# Patient Record
Sex: Female | Born: 1980
Health system: Southern US, Community
[De-identification: ages and names within clinical notes are randomized; demographics above are authoritative.]

## PROBLEM LIST (undated history)

## (undated) DIAGNOSIS — D649 Anemia, unspecified: Secondary | ICD-10-CM

## (undated) DIAGNOSIS — F419 Anxiety disorder, unspecified: Secondary | ICD-10-CM

## (undated) DIAGNOSIS — B999 Unspecified infectious disease: Secondary | ICD-10-CM

## (undated) HISTORY — DX: Anxiety disorder, unspecified: F41.9

## (undated) HISTORY — PX: WISDOM TOOTH EXTRACTION: SHX21

## (undated) HISTORY — DX: Anemia, unspecified: D64.9

## (undated) HISTORY — DX: Unspecified infectious disease: B99.9

---

## 2003-03-28 ENCOUNTER — Other Ambulatory Visit: Admission: RE | Admit: 2003-03-28 | Discharge: 2003-03-28 | Payer: Self-pay | Admitting: Obstetrics and Gynecology

## 2005-02-13 ENCOUNTER — Other Ambulatory Visit: Admission: RE | Admit: 2005-02-13 | Discharge: 2005-02-13 | Payer: Self-pay | Admitting: Obstetrics and Gynecology

## 2009-09-30 ENCOUNTER — Inpatient Hospital Stay (HOSPITAL_COMMUNITY): Admission: AD | Admit: 2009-09-30 | Discharge: 2009-10-03 | Payer: Self-pay | Admitting: Obstetrics and Gynecology

## 2009-10-01 ENCOUNTER — Encounter (INDEPENDENT_AMBULATORY_CARE_PROVIDER_SITE_OTHER): Payer: Self-pay | Admitting: Obstetrics and Gynecology

## 2009-10-14 ENCOUNTER — Inpatient Hospital Stay (HOSPITAL_COMMUNITY): Admission: AD | Admit: 2009-10-14 | Discharge: 2009-10-14 | Payer: Self-pay | Admitting: Obstetrics & Gynecology

## 2010-02-20 ENCOUNTER — Inpatient Hospital Stay (HOSPITAL_COMMUNITY): Admission: AD | Admit: 2010-02-20 | Payer: Self-pay | Admitting: Obstetrics and Gynecology

## 2010-03-21 LAB — RPR: RPR Ser Ql: NONREACTIVE

## 2010-03-21 LAB — CBC
HCT: 35.7 % — ABNORMAL LOW (ref 36.0–46.0)
HCT: 33.4 % — ABNORMAL LOW (ref 36.0–46.0)
Hemoglobin: 12.1 g/dL (ref 12.0–15.0)
MCHC: 34 g/dL (ref 30.0–36.0)
MCHC: 34.6 g/dL (ref 30.0–36.0)
MCV: 95.5 fL (ref 78.0–100.0)
Platelets: 126 10*3/uL — ABNORMAL LOW (ref 150–400)
RBC: 3.73 MIL/uL — ABNORMAL LOW (ref 3.87–5.11)
RDW: 13 % (ref 11.5–15.5)
RDW: 13.5 % (ref 11.5–15.5)
RDW: 13.6 % (ref 11.5–15.5)
WBC: 9.9 10*3/uL (ref 4.0–10.5)
WBC: 10.1 10*3/uL (ref 4.0–10.5)
WBC: 13.9 10*3/uL — ABNORMAL HIGH (ref 4.0–10.5)

## 2010-03-21 LAB — URINALYSIS, ROUTINE W REFLEX MICROSCOPIC
Bilirubin Urine: NEGATIVE
Glucose, UA: NEGATIVE mg/dL
Ketones, ur: NEGATIVE mg/dL
Nitrite: NEGATIVE
Specific Gravity, Urine: <1.005 — ABNORMAL LOW (ref 1.005–1.030)
pH: 5 (ref 5.0–8.0)

## 2010-03-21 LAB — DIFFERENTIAL
Basophils Absolute: 0 10*3/uL (ref 0.0–0.1)
Eosinophils Relative: 0 % (ref 0–5)
Lymphocytes Relative: 7 % — ABNORMAL LOW (ref 12–46)
Lymphs Abs: 0.7 10*3/uL (ref 0.7–4.0)
Monocytes Absolute: 0.3 10*3/uL (ref 0.1–1.0)
Neutro Abs: 8.9 10*3/uL — ABNORMAL HIGH (ref 1.7–7.7)

## 2010-03-21 LAB — URINE MICROSCOPIC-ADD ON

## 2010-03-21 LAB — GLUCOSE, CAPILLARY: Glucose-Capillary: 105 mg/dL — ABNORMAL HIGH (ref 70–99)

## 2012-01-06 ENCOUNTER — Ambulatory Visit (INDEPENDENT_AMBULATORY_CARE_PROVIDER_SITE_OTHER): Payer: BC Managed Care – PPO | Admitting: Obstetrics and Gynecology

## 2012-01-06 DIAGNOSIS — Z331 Pregnant state, incidental: Secondary | ICD-10-CM

## 2012-01-06 LAB — POCT URINALYSIS DIPSTICK
Bilirubin, UA: NEGATIVE
Glucose, UA: NEGATIVE
Spec Grav, UA: 1.02
Urobilinogen, UA: NEGATIVE

## 2012-01-06 NOTE — Progress Notes (Signed)
NOB interview completed.  PNV samples given.  Pt doing well. 

## 2012-01-07 ENCOUNTER — Encounter: Payer: Self-pay | Admitting: Obstetrics and Gynecology

## 2012-01-07 DIAGNOSIS — Q249 Congenital malformation of heart, unspecified: Secondary | ICD-10-CM | POA: Insufficient documentation

## 2012-01-07 DIAGNOSIS — F419 Anxiety disorder, unspecified: Secondary | ICD-10-CM | POA: Insufficient documentation

## 2012-01-07 NOTE — L&D Delivery Note (Signed)
Delivery Note At 7:00 PM a viable female was delivered via Vaginal, Spontaneous Delivery (Presentation: Left Occiput Anterior).  APGAR: 8, 9; weight .   Placenta status: Intact, Spontaneous.  Cord: 3 vessels with the following complications: .  Cord pH: not sent  Anesthesia: Epidural  Episiotomy: None Lacerations: 2nd degree Suture Repair: 2.0 chromic Est. Blood Loss (mL): 350  Mom to postpartum.  Baby to nursery-stable.  Aquiles Ruffini A 08/25/2012, 7:42 PM

## 2012-01-08 LAB — PRENATAL PANEL VII
Eosinophils Absolute: 0.1 10*3/uL (ref 0.0–0.7)
HIV: NONREACTIVE
Hemoglobin: 11.5 g/dL — ABNORMAL LOW (ref 12.0–15.0)
Hepatitis B Surface Ag: NEGATIVE
Lymphocytes Relative: 27 % (ref 12–46)
Lymphs Abs: 2 10*3/uL (ref 0.7–4.0)
Monocytes Relative: 7 % (ref 3–12)
Neutro Abs: 4.8 10*3/uL (ref 1.7–7.7)
Neutrophils Relative %: 65 % (ref 43–77)
Platelets: 247 10*3/uL (ref 150–400)
RBC: 3.78 MIL/uL — ABNORMAL LOW (ref 3.87–5.11)
Rubella: 1.33 Index — ABNORMAL HIGH (ref <0.90)
WBC: 7.4 10*3/uL (ref 4.0–10.5)

## 2012-01-08 LAB — CULTURE, OB URINE: Colony Count: NO GROWTH

## 2012-01-12 ENCOUNTER — Telehealth: Payer: Self-pay | Admitting: Obstetrics and Gynecology

## 2012-01-13 NOTE — Telephone Encounter (Signed)
Pt advised  Vonda Harth, CMA  

## 2012-01-13 NOTE — Telephone Encounter (Signed)
Nothing was significantly abnormal with her NOB labs..don't know what the phone call was about, unless it was to inform her of next visit. VL

## 2012-01-13 NOTE — Telephone Encounter (Signed)
Spoke with pt to advise that results would be discussed at f/u apt 01/28, would send to VL to have her advise if anything needs to be discussed before apt.   Dental note faxed to dentist office [phone]  Darien Ramus, CMA

## 2012-02-03 ENCOUNTER — Ambulatory Visit: Payer: BC Managed Care – PPO | Admitting: Obstetrics and Gynecology

## 2012-02-03 VITALS — BP 100/68 | Ht 63.0 in | Wt 120.0 lb

## 2012-02-03 DIAGNOSIS — Z331 Pregnant state, incidental: Secondary | ICD-10-CM

## 2012-02-03 NOTE — Progress Notes (Signed)
   Kylie Phillips is being seen today for her first obstetrical visit at [redacted]w[redacted]d gestation by LMP.  She reports sore throat and congestion for the last 2 weeks.  In the last week it has been just congestion and feels like it has moved down to her chest.  Cough is wet, but is not coughing up anything.  She has tried sudafed and nasal saline with little relief.  Went to see her PCP who rx'd Amoxicillin to take if sx get worse.  Pt requested flu shot today -- will wait for her URI to resolve  Her obstetrical history is significant for: Patient Active Problem List  Diagnosis  . Prolonged second stage of labor  . Anxiety  . Congenital heart disease    Relationship with FOB:  Married; Kylie Phillips joined her today at her visit. She is employed  Feeding plan:   Breastfeeding  Pregnancy history fully reviewed.  The following portions of the patient's history were reviewed and updated as appropriate: allergies, current medications, past family history, past medical history, past social history, past surgical history and problem list.  Review of Systems Pertinent ROS is described in HPI   Objective:   BP 100/68  Ht 5\' 3"  (1.6 m)  Wt 120 lb (54.432 kg)  BMI 21.26 kg/m2  LMP 11/20/2011 Wt Readings from Last 1 Encounters:  02/03/12 120 lb (54.432 kg)   BMI: Body mass index is 21.26 kg/(m^2).  General: alert, cooperative and no distress HEENT: grossly normal  Ears: Right some fluid seen behind TM, no erythema.  Left fluid and erythema noted. Thyroid: normal  Respiratory: clear to auscultation bilaterally Cardiovascular: regular rate and rhythm,  Breasts:  No dominant masses, nipples erect Gastrointestinal: soft, non-tender; no masses,  no organomegaly Extremities: extremities normal, no pain or edema Vaginal Bleeding: None  EXTERNAL GENITALIA: normal appearing vulva with no masses, tenderness or lesions VAGINA: no abnormal discharge or lesions CERVIX: no lesions or cervical motion  tenderness; cervix closed, long, firm UTERUS: gravid and consistent with 10 weeks ADNEXA: no masses palpable and nontender OB EXAM PELVIMETRY: appears adequate  FHR:  150  bpm  Assessment:    Pregnancy at  10w 5d URI -- will start Amoxicillin rx'd by PCP FOB-siblings had a hole in the heart (one died @ birth other is living) - Fetal echo at 20 weeks Needs flu shot -- at [facility] if URI resolved Considering waterbirth  Plan:     Prenatal panel reviewed and discussed with the patient:  yes  Pap smear collected:  Done in 2012 GC/Chlamydia collected:  yes Wet prep:  Neg Discussion of Genetic testing options: Requests 1st trimester screening. Prenatal vitamins recommended  Plan of care: Next visit:  4 weeks for ROB Other anticipated f/u:  Return in 2 weeks for 1st trimester screening Fetal echo needs to be scheduled around 20 weeks Flu shot at [facility]  Nigel Bridgeman, CNM, [place]

## 2012-02-03 NOTE — Progress Notes (Signed)
Pt is here today for her NOB work-up. Last pap was 01/06/2011 wnl . Pt used the restroom and forgot to leave a sample.

## 2012-02-04 LAB — GC/CHLAMYDIA PROBE AMP
CT Probe RNA: NEGATIVE
GC Probe RNA: NEGATIVE

## 2012-02-06 ENCOUNTER — Encounter: Payer: Self-pay | Admitting: Obstetrics and Gynecology

## 2012-02-17 ENCOUNTER — Ambulatory Visit: Payer: BC Managed Care – PPO

## 2012-02-17 ENCOUNTER — Other Ambulatory Visit: Payer: Self-pay

## 2012-02-17 ENCOUNTER — Other Ambulatory Visit: Payer: Self-pay | Admitting: Obstetrics and Gynecology

## 2012-02-17 DIAGNOSIS — Z36 Encounter for antenatal screening of mother: Secondary | ICD-10-CM

## 2012-02-17 LAB — US OB COMP LESS 14 WKS

## 2012-02-21 ENCOUNTER — Other Ambulatory Visit: Payer: Self-pay

## 2012-03-02 ENCOUNTER — Encounter: Payer: Self-pay | Admitting: Obstetrics and Gynecology

## 2012-03-02 ENCOUNTER — Ambulatory Visit: Payer: BC Managed Care – PPO | Admitting: Obstetrics and Gynecology

## 2012-03-02 VITALS — BP 98/60 | Wt 119.0 lb

## 2012-03-02 DIAGNOSIS — Z3689 Encounter for other specified antenatal screening: Secondary | ICD-10-CM

## 2012-03-02 DIAGNOSIS — Z331 Pregnant state, incidental: Secondary | ICD-10-CM

## 2012-03-02 NOTE — Progress Notes (Signed)
[redacted]w[redacted]d Pt has no complaints

## 2012-03-02 NOTE — Progress Notes (Signed)
[redacted]w[redacted]d  More energy and nausea resolved 1st trimester screen normal AFP and anatomy at NV. Fetal echo at 22 weeks

## 2012-03-02 NOTE — Addendum Note (Signed)
Addended by: Darien Ramus on: 03/02/2012 09:41 AM   Modules accepted: Orders

## 2012-03-02 NOTE — Progress Notes (Signed)
[redacted]w[redacted]d Pt has no complaints   

## 2012-03-30 ENCOUNTER — Other Ambulatory Visit: Payer: Self-pay | Admitting: Obstetrics and Gynecology

## 2012-03-31 LAB — ALPHA FETOPROTEIN, MATERNAL
MoM for AFP: 0.7
Open Spina bifida: NEGATIVE

## 2012-08-24 ENCOUNTER — Inpatient Hospital Stay (HOSPITAL_COMMUNITY)
Admission: AD | Admit: 2012-08-24 | Discharge: 2012-08-27 | DRG: 372 | Disposition: A | Discharge status: Home / Self Care | Payer: BC Managed Care – PPO | Source: Ambulatory Visit | Attending: Obstetrics and Gynecology | Admitting: Obstetrics and Gynecology

## 2012-08-24 ENCOUNTER — Encounter (HOSPITAL_COMMUNITY): Payer: Self-pay

## 2012-08-24 DIAGNOSIS — F419 Anxiety disorder, unspecified: Secondary | ICD-10-CM

## 2012-08-24 DIAGNOSIS — Q249 Congenital malformation of heart, unspecified: Secondary | ICD-10-CM

## 2012-08-24 DIAGNOSIS — O9903 Anemia complicating the puerperium: Secondary | ICD-10-CM | POA: Diagnosis not present

## 2012-08-24 DIAGNOSIS — D649 Anemia, unspecified: Secondary | ICD-10-CM | POA: Diagnosis not present

## 2012-08-24 DIAGNOSIS — O4290 Premature rupture of membranes, unspecified as to length of time between rupture and onset of labor, unspecified weeks of gestation: Principal | ICD-10-CM | POA: Diagnosis present

## 2012-08-24 NOTE — MAU Note (Signed)
SVE 3cm in office today, membranes stripped. Some irregular contractions since then. Had extra leak of fluid in toilet tonight after urinating and unsure if water broke. No leaking since. Underwear is somewhat damp but states it always feels like that b/c of normal discharge.

## 2012-08-25 ENCOUNTER — Encounter (HOSPITAL_COMMUNITY): Payer: Self-pay

## 2012-08-25 ENCOUNTER — Inpatient Hospital Stay (HOSPITAL_COMMUNITY): Payer: BC Managed Care – PPO | Admitting: Anesthesiology

## 2012-08-25 ENCOUNTER — Encounter (HOSPITAL_COMMUNITY): Payer: Self-pay | Admitting: Anesthesiology

## 2012-08-25 LAB — CBC
HCT: 31.1 % — ABNORMAL LOW (ref 36.0–46.0)
MCH: 30.2 pg (ref 26.0–34.0)
MCHC: 33.4 g/dL (ref 30.0–36.0)
MCV: 90.4 fL (ref 78.0–100.0)
RDW: 15.1 % (ref 11.5–15.5)

## 2012-08-25 LAB — AMNISURE RUPTURE OF MEMBRANE (ROM) NOT AT ARMC: Amnisure ROM: POSITIVE

## 2012-08-25 MED ORDER — FENTANYL 2.5 MCG/ML BUPIVACAINE 1/10 % EPIDURAL INFUSION (WH - ANES)
14.0000 mL/h | INTRAMUSCULAR | Status: DC | PRN
  Filled 2012-08-25: qty 125

## 2012-08-25 MED ORDER — OXYTOCIN 40 UNITS IN LACTATED RINGERS INFUSION - SIMPLE MED: 62.5000 mL/h | INTRAVENOUS | Status: DC

## 2012-08-25 MED ORDER — PROMETHAZINE HCL 25 MG/ML IJ SOLN: 12.5000 mg | Freq: Four times a day (QID) | INTRAMUSCULAR | Status: DC | PRN

## 2012-08-25 MED ORDER — LACTATED RINGERS IV SOLN
INTRAVENOUS | Status: DC
  Administered 2012-08-25: 04:00:00 via INTRAVENOUS

## 2012-08-25 MED ORDER — PRENATAL MULTIVITAMIN CH
1.0000 | ORAL_TABLET | Freq: Every day | ORAL | Status: DC
  Administered 2012-08-26: 1 via ORAL
  Filled 2012-08-25: qty 1

## 2012-08-25 MED ORDER — WITCH HAZEL-GLYCERIN EX PADS: 1.0000 "application " | MEDICATED_PAD | CUTANEOUS | Status: DC | PRN

## 2012-08-25 MED ORDER — IBUPROFEN 600 MG PO TABS
600.0000 mg | ORAL_TABLET | Freq: Four times a day (QID) | ORAL | Status: DC
  Administered 2012-08-26 – 2012-08-27 (×6): 600 mg via ORAL
  Filled 2012-08-25 (×6): qty 1

## 2012-08-25 MED ORDER — OXYCODONE-ACETAMINOPHEN 5-325 MG PO TABS: 1.0000 | ORAL_TABLET | ORAL | Status: DC | PRN

## 2012-08-25 MED ORDER — CITRIC ACID-SODIUM CITRATE 334-500 MG/5ML PO SOLN: 30.0000 mL | ORAL | Status: DC | PRN

## 2012-08-25 MED ORDER — OXYCODONE-ACETAMINOPHEN 5-325 MG PO TABS
1.0000 | ORAL_TABLET | ORAL | Status: DC | PRN
  Administered 2012-08-26: 1 via ORAL
  Filled 2012-08-25: qty 1

## 2012-08-25 MED ORDER — LACTATED RINGERS IV SOLN: 500.0000 mL | INTRAVENOUS | Status: DC | PRN

## 2012-08-25 MED ORDER — ZOLPIDEM TARTRATE 5 MG PO TABS: 5.0000 mg | ORAL_TABLET | Freq: Every evening | ORAL | Status: DC | PRN

## 2012-08-25 MED ORDER — BENZOCAINE-MENTHOL 20-0.5 % EX AERO: 1.0000 "application " | INHALATION_SPRAY | CUTANEOUS | Status: DC | PRN

## 2012-08-25 MED ORDER — SODIUM CHLORIDE 0.9 % IJ SOLN: 3.0000 mL | Freq: Two times a day (BID) | INTRAMUSCULAR | Status: DC

## 2012-08-25 MED ORDER — SENNOSIDES-DOCUSATE SODIUM 8.6-50 MG PO TABS
2.0000 | ORAL_TABLET | Freq: Every day | ORAL | Status: DC
  Administered 2012-08-26: 2 via ORAL

## 2012-08-25 MED ORDER — MEASLES, MUMPS & RUBELLA VAC ~~LOC~~ INJ: 0.5000 mL | INJECTION | Freq: Once | SUBCUTANEOUS | Status: DC

## 2012-08-25 MED ORDER — EPHEDRINE 5 MG/ML INJ
10.0000 mg | INTRAVENOUS | Status: DC | PRN
  Filled 2012-08-25: qty 2
  Filled 2012-08-25: qty 4

## 2012-08-25 MED ORDER — FENTANYL 2.5 MCG/ML BUPIVACAINE 1/10 % EPIDURAL INFUSION (WH - ANES)
INTRAMUSCULAR | Status: DC | PRN
  Administered 2012-08-25: 14 mL/h via EPIDURAL

## 2012-08-25 MED ORDER — ONDANSETRON HCL 4 MG/2ML IJ SOLN: 4.0000 mg | INTRAMUSCULAR | Status: DC | PRN

## 2012-08-25 MED ORDER — ONDANSETRON HCL 4 MG/2ML IJ SOLN: 4.0000 mg | Freq: Four times a day (QID) | INTRAMUSCULAR | Status: DC | PRN

## 2012-08-25 MED ORDER — DIBUCAINE 1 % RE OINT: 1.0000 "application " | TOPICAL_OINTMENT | RECTAL | Status: DC | PRN

## 2012-08-25 MED ORDER — SIMETHICONE 80 MG PO CHEW: 80.0000 mg | CHEWABLE_TABLET | ORAL | Status: DC | PRN

## 2012-08-25 MED ORDER — ACETAMINOPHEN 325 MG PO TABS: 650.0000 mg | ORAL_TABLET | ORAL | Status: DC | PRN

## 2012-08-25 MED ORDER — LIDOCAINE HCL (PF) 1 % IJ SOLN
INTRAMUSCULAR | Status: DC | PRN
  Administered 2012-08-25: 5 mL

## 2012-08-25 MED ORDER — DIPHENHYDRAMINE HCL 25 MG PO CAPS: 25.0000 mg | ORAL_CAPSULE | Freq: Four times a day (QID) | ORAL | Status: DC | PRN

## 2012-08-25 MED ORDER — OXYTOCIN BOLUS FROM INFUSION: 500.0000 mL | INTRAVENOUS | Status: DC

## 2012-08-25 MED ORDER — LANOLIN HYDROUS EX OINT: TOPICAL_OINTMENT | CUTANEOUS | Status: DC | PRN

## 2012-08-25 MED ORDER — PHENYLEPHRINE 40 MCG/ML (10ML) SYRINGE FOR IV PUSH (FOR BLOOD PRESSURE SUPPORT)
80.0000 ug | PREFILLED_SYRINGE | INTRAVENOUS | Status: DC | PRN
  Filled 2012-08-25: qty 2
  Filled 2012-08-25: qty 5

## 2012-08-25 MED ORDER — ONDANSETRON HCL 4 MG PO TABS: 4.0000 mg | ORAL_TABLET | ORAL | Status: DC | PRN

## 2012-08-25 MED ORDER — SODIUM CHLORIDE 0.9 % IV SOLN: 250.0000 mL | INTRAVENOUS | Status: DC | PRN

## 2012-08-25 MED ORDER — LACTATED RINGERS IV SOLN
500.0000 mL | Freq: Once | INTRAVENOUS | Status: AC
  Administered 2012-08-25: 500 mL via INTRAVENOUS

## 2012-08-25 MED ORDER — IBUPROFEN 600 MG PO TABS: 600.0000 mg | ORAL_TABLET | Freq: Four times a day (QID) | ORAL | Status: DC | PRN

## 2012-08-25 MED ORDER — TETANUS-DIPHTH-ACELL PERTUSSIS 5-2.5-18.5 LF-MCG/0.5 IM SUSP: 0.5000 mL | Freq: Once | INTRAMUSCULAR | Status: DC

## 2012-08-25 MED ORDER — PHENYLEPHRINE 40 MCG/ML (10ML) SYRINGE FOR IV PUSH (FOR BLOOD PRESSURE SUPPORT)
80.0000 ug | PREFILLED_SYRINGE | INTRAVENOUS | Status: DC | PRN
  Filled 2012-08-25: qty 2

## 2012-08-25 MED ORDER — SODIUM CHLORIDE 0.9 % IJ SOLN: 3.0000 mL | INTRAMUSCULAR | Status: DC | PRN

## 2012-08-25 MED ORDER — DIPHENHYDRAMINE HCL 50 MG/ML IJ SOLN: 12.5000 mg | INTRAMUSCULAR | Status: DC | PRN

## 2012-08-25 MED ORDER — LIDOCAINE HCL (PF) 1 % IJ SOLN
30.0000 mL | INTRAMUSCULAR | Status: DC | PRN
  Filled 2012-08-25 (×2): qty 30

## 2012-08-25 MED ORDER — BUTORPHANOL TARTRATE 1 MG/ML IJ SOLN: 2.0000 mg | INTRAMUSCULAR | Status: DC | PRN

## 2012-08-25 MED ORDER — OXYTOCIN 40 UNITS IN LACTATED RINGERS INFUSION - SIMPLE MED
1.0000 m[IU]/min | INTRAVENOUS | Status: DC
  Administered 2012-08-25: 2 m[IU]/min via INTRAVENOUS
  Filled 2012-08-25: qty 1000

## 2012-08-25 MED ORDER — TERBUTALINE SULFATE 1 MG/ML IJ SOLN: 0.2500 mg | Freq: Once | INTRAMUSCULAR | Status: DC | PRN

## 2012-08-25 MED ORDER — EPHEDRINE 5 MG/ML INJ
10.0000 mg | INTRAVENOUS | Status: DC | PRN
  Filled 2012-08-25: qty 2

## 2012-08-25 NOTE — Progress Notes (Signed)
  Subjective: Pt up sitting on birth ball.  IA monitoring - NSTs Q 2 hours.  Has also ambulated in the halls recently.  Discussed nipple stimulation.  Discussed started pitocin at this time and pt declined, again discussed risks of infection, pt understands risk and would like to eat breakfast before starting IOL.  Pt reports  Very mild cramping at times.  Objective: BP 108/74  Pulse 94  Temp(Src) 98.7 F (37.1 C) (Oral)  Resp 18  Ht 5\' 3"  (1.6 m)  Wt 150 lb (68.04 kg)  BMI 26.58 kg/m2  LMP 11/20/2011      FHT:  Cat I UC:   None reported   SVE:   Dilation: 3 Effacement (%): 70 Station: -2 Exam by:: Haroldine Laws CNM  Assessment / Plan:  Labor: PROM, waiting for labor to start on its own.  Will eat a light breakfast then re-evaluate starting pitocin Preeclampsia: no s/s Fetal Wellbeing: Cat I Pain Control: n/a at this time. Would like to avoid epidural if able, but is concerned she will need epidural if pitocin is started I/D: GBS neg; PROM since 2100 (x 8 hours) Anticipated MOD: SVD   Kylie Phillips 08/25/2012, 5:19 AM

## 2012-08-25 NOTE — Progress Notes (Signed)
Kylie Phillips is a 32 y.o. G2P1001 at [redacted]w[redacted]d by LMP admitted for rupture of membranes  Subjective: Pt is upset she is not in labor.  She is ready to start pitocin  Objective: BP 101/55  Pulse 103  Temp(Src) 98.7 F (37.1 C) (Oral)  Resp 18  Ht 5\' 3"  (1.6 m)  Wt 150 lb (68.04 kg)  BMI 26.58 kg/m2  LMP 11/20/2011      FHT:  FHR: 140 bpm, variability: moderate,  accelerations:  Present,  decelerations:  Absent UC:   none SVE:   Dilation: 3 Effacement (%): 70 Station: -2 Exam by:: [employer] CNM  Labs: Lab Results  Component Value Date   WBC 10.7* 08/25/2012   HGB 10.4* 08/25/2012   HCT 31.1* 08/25/2012   MCV 90.4 08/25/2012   PLT 173 08/25/2012    Assessment / Plan: PROM  Labor: NO LABOR YET. WILL START PITOCIN Preeclampsia:  no signs or symptoms of toxicity Fetal Wellbeing:  Category I Pain Control:  Labor support without medications and PT  MAY HAVE IV PAIN MEDS OR EPIDURAL  I/D:  n/a Anticipated MOD:  NSVD  Kylie Phillips A 08/25/2012, 10:54 AM

## 2012-08-25 NOTE — Anesthesia Preprocedure Evaluation (Signed)
Anesthesia Evaluation  Patient identified by MRN, date of birth, ID band Patient awake    Reviewed: Allergy & Precautions, H&P , Patient's Chart, lab work & pertinent test results  Airway Mallampati: II TM Distance: >3 FB Neck ROM: full    Dental no notable dental hx.    Pulmonary neg pulmonary ROS,  breath sounds clear to auscultation  Pulmonary exam normal       Cardiovascular negative cardio ROS  Rhythm:regular Rate:Normal     Neuro/Psych negative neurological ROS  negative psych ROS   GI/Hepatic negative GI ROS, Neg liver ROS,   Endo/Other  negative endocrine ROS  Renal/GU negative Renal ROS     Musculoskeletal   Abdominal   Peds  Hematology negative hematology ROS (+) anemia ,   Anesthesia Other Findings   Reproductive/Obstetrics (+) Pregnancy                           Anesthesia Physical Anesthesia Plan  ASA: II  Anesthesia Plan: Epidural   Post-op Pain Management:    Induction:   Airway Management Planned:   Additional Equipment:   Intra-op Plan:   Post-operative Plan:   Informed Consent: I have reviewed the patients History and Physical, chart, labs and discussed the procedure including the risks, benefits and alternatives for the proposed anesthesia with the patient or authorized representative who has indicated his/her understanding and acceptance.     Plan Discussed with:   Anesthesia Plan Comments:         Anesthesia Quick Evaluation  

## 2012-08-25 NOTE — H&P (Signed)
Kylie Phillips is a 32 y.o. female, G2P1001 at [redacted]w[redacted]d presenting for  PROM since 2100.  Leaking noted when she went to the bathroom, attempted pad test with unsure results.  Fluid clear.  Membranes striped in office today.  Pt reports mild UCs since admission to MAU.  Denies UCs, recent fever, resp or GI c/o's, UTI or PIH s/s. GFM.   Patient Active Problem List   Diagnosis Date Noted  . Prolonged second stage of labor 01/07/2012  . Anxiety 01/07/2012  . Congenital heart disease 01/07/2012    History of present pregnancy: Patient entered care at 6 weeks.   EDC of 08/26/12 was established by LMP.   Anatomy scan: 18 weeks, with normal findings and an anterior placenta.   Additional [place] evaluations:  none.   Significant prenatal events:  Fetal echo at 21 weeks d/t a family hx of cardiac defect - normal fetal cardiac anatomy and function   Last evaluation:  08/24/12 at [redacted]w[redacted]d   3 cm / 70% / -1  OB History   Grav Para Term Preterm Abortions TAB SAB Ect Mult Living   2 1 1       1      Past Medical History  Diagnosis Date  . Anxiety     No meds  . Infection     Yeast x 1  . Anemia     During previous pregnancy;needed FeSO4 supp   Past Surgical History  Procedure Laterality Date  . Wisdom tooth extraction      All are removed   Family History: family history includes Anemia in her mother; Anxiety disorder in her mother; Asthma in her mother; Bipolar disorder in her paternal aunt; Congestive Heart Failure in her paternal grandfather; Diabetes in her maternal grandmother; Heart attack in her maternal grandfather and paternal grandfather; Hypertension in her maternal grandmother; Kidney disease in her maternal grandfather; Migraines in her father; Skin cancer in her maternal grandfather. Social History:  reports that she has never smoked. She has never used smokeless tobacco. She reports that she does not drink alcohol or use illicit drugs.   Prenatal Transfer Tool  Maternal Diabetes:  No Genetic Screening: Normal Maternal Ultrasounds/Referrals: Normal Fetal Ultrasounds or other Referrals:  Fetal echo Maternal Substance Abuse:  No Significant Maternal Medications:  None Significant Maternal Lab Results: Lab values include: Group B Strep negative    ROS: see HPI above, all other systems are negative  No Known Allergies   Dilation: 3 Effacement (%): 70 Station: -2 Exam by:: Haroldine Laws CNM Blood pressure 113/77, pulse 101, temperature 97.9 F (36.6 C), temperature source Oral, resp. rate 18, height 5\' 3"  (1.6 m), weight 150 lb (68.04 kg), last menstrual period 11/20/2011.  Chest clear Heart RRR without murmur Abd gravid, NT Ext: WNL  FHR: Cat I UCs:  Q 3-5 min  Prenatal labs: ABO, Rh: B/POS/-- (12/31 1521) Antibody: NEG (12/31 1521) Rubella:   Immune RPR: NON REAC (12/31 1521)  HBsAg: NEGATIVE (12/31 1521)  HIV: NON REACTIVE (12/31 1521)  GBS: Negative (07/23 0000) Sickle cell/Hgb electrophoresis:  n/a Pap:  01/06/11  WNL GC:  Neg Chlamydia:  Neg Genetic screenings:  AFP - normal Glucola:  115 Other:  none    Assessment/Plan: IUP at [redacted]w[redacted]d PROM GBS neg Desires to wait to see if she goes into labor on her own  Admit to BS per c/w Dr. Richardson Dopp as attending MD Routine [facility] orders Discussed at length the risks and benefits to pitocin IOL and waiting  to see if labor begins on its own - pt voiced understanding and wishes to wait 4 hours before starting Pitocin  Rowan Blase, MSN 08/25/2012, 1:03 AM

## 2012-08-25 NOTE — Anesthesia Procedure Notes (Signed)
Epidural Patient location during procedure: OB Start time: 08/25/2012 5:35 PM  Staffing Anesthesiologist: Angus Seller., Harrell Gave. Performed by: anesthesiologist   Preanesthetic Checklist Completed: patient identified, site marked, surgical consent, pre-op evaluation, timeout performed, IV checked, risks and benefits discussed and monitors and equipment checked  Epidural Patient position: sitting Prep: site prepped and draped and DuraPrep Patient monitoring: continuous pulse ox and blood pressure Approach: midline Injection technique: LOR air and LOR saline  Needle:  Needle type: Tuohy  Needle gauge: 17 G Needle length: 9 cm and 9 Needle insertion depth: 5 cm cm Catheter type: closed end flexible Catheter size: 19 Gauge Catheter at skin depth: 10 cm Test dose: negative  Assessment Events: blood not aspirated, injection not painful, no injection resistance, negative IV test and no paresthesia  Additional Notes Patient identified.  Risk benefits discussed including failed block, incomplete pain control, headache, nerve damage, paralysis, blood pressure changes, nausea, vomiting, reactions to medication both toxic or allergic, and postpartum back pain.  Patient expressed understanding and wished to proceed.  All questions were answered.  Sterile technique used throughout procedure and epidural site dressed with sterile barrier dressing. No paresthesia or other complications noted.The patient did not experience any signs of intravascular injection such as tinnitus or metallic taste in mouth nor signs of intrathecal spread such as rapid motor block. Please see nursing notes for vital signs.

## 2012-08-26 LAB — CBC
Hemoglobin: 9.5 g/dL — ABNORMAL LOW (ref 12.0–15.0)
MCH: 30.4 pg (ref 26.0–34.0)
MCV: 91 fL (ref 78.0–100.0)
Platelets: 153 10*3/uL (ref 150–400)
RBC: 3.12 MIL/uL — ABNORMAL LOW (ref 3.87–5.11)
WBC: 8.9 10*3/uL (ref 4.0–10.5)

## 2012-08-26 MED ORDER — CALCIUM CARBONATE ANTACID 500 MG PO CHEW
800.0000 mg | CHEWABLE_TABLET | Freq: Four times a day (QID) | ORAL | Status: DC | PRN
  Administered 2012-08-26: 500 mg via ORAL
  Administered 2012-08-26: 200 mg via ORAL
  Administered 2012-08-27: 800 mg via ORAL
  Filled 2012-08-26: qty 4
  Filled 2012-08-26 (×2): qty 1

## 2012-08-26 NOTE — Anesthesia Postprocedure Evaluation (Signed)
  Anesthesia Post-op Note  Patient: Kylie Phillips  Procedure(s) Performed: * No procedures listed *  Patient Location: PACU and Mother/Baby  Anesthesia Type:Epidural  Level of Consciousness: awake, alert  and oriented  Airway and Oxygen Therapy: Patient Spontanous Breathing  Post-op Pain: none  Post-op Assessment: Post-op Vital signs reviewed, Patient's Cardiovascular Status Stable, No headache, No backache, No residual numbness and No residual motor weakness  Post-op Vital Signs: Reviewed and stable  Complications: No apparent anesthesia complications

## 2012-08-27 DIAGNOSIS — D649 Anemia, unspecified: Secondary | ICD-10-CM | POA: Diagnosis present

## 2012-08-27 MED ORDER — OXYCODONE-ACETAMINOPHEN 5-325 MG PO TABS: 1.0000 | ORAL_TABLET | ORAL | Status: DC | PRN

## 2012-08-27 MED ORDER — IBUPROFEN 600 MG PO TABS: 600.0000 mg | ORAL_TABLET | Freq: Four times a day (QID) | ORAL | Status: DC

## 2012-08-27 NOTE — Discharge Summary (Signed)
   Obstetric Discharge Summary Reason for Admission: rupture of membranes Prenatal Procedures: none Intrapartum Procedures: spontaneous vaginal delivery Postpartum Procedures: none Complications-Operative and Postpartum: 2nd degree perineal laceration  Temp:  [98.1 F (36.7 C)-98.3 F (36.8 C)] 98.1 F (36.7 C) (08/22 0606) Pulse Rate:  [71-75] 75 (08/22 0606) Resp:  [17-18] 17 (08/22 0606) BP: (95-103)/(61-69) 95/61 mmHg (08/22 0606) SpO2:  [98 %] 98 % (08/22 0606)  Post Partum Day 2 Subjective: no complaints, up ad lib, voiding and tolerating PO  Objective: Blood pressure 95/61, pulse 75, temperature 98.1 F (36.7 C), temperature source Oral, resp. rate 17, height 5\' 3"  (1.6 m), weight 150 lb (68.04 kg), last menstrual period 11/20/2011, SpO2 98.00%, unknown if currently breastfeeding.  Physical Exam:  General: alert, cooperative, appears stated age, no distress and nursing going well Lochia: appropriate Uterine Fundus: firm DVT Evaluation: No evidence of DVT seen on physical exam.   Hemoglobin  Date Value Range Status  08/26/2012 9.5* 12.0 - 15.0 g/dL Final     HCT  Date Value Range Status  08/26/2012 28.4* 36.0 - 46.0 % Final    Hospital Course:  Hospital Course: Admitted with Prom. Neg GBS. . Delivery was performed by Dr. Normand Sloop without difficulty. Patient and baby tolerated the procedure without difficulty, with a 2nd laceration noted. Infant to FTN. Mother and infant then had an uncomplicated postpartum course, with breast feeding going well. Mom's physical exam was WNL, and she was discharged home in stable condition. Contraception plan was condoms.  She received adequate benefit from po pain medications.  Discharge Diagnoses: Term Pregnancy-delivered and anemia  Discharge Information: Date: 08/27/2012 Activity: pelvic rest Diet: routine Medications:    Medication List         calcium carbonate 500 MG chewable tablet  Commonly known as:  TUMS - dosed in  mg elemental calcium  Chew 1 tablet by mouth daily.     docusate sodium 100 MG capsule  Commonly known as:  COLACE  Take 100 mg by mouth daily. Patient states that she takes stool softener with iron tablets.     ferrous sulfate 325 (65 FE) MG tablet  Take 325 mg by mouth daily with breakfast.     ibuprofen 600 MG tablet  Commonly known as:  ADVIL,MOTRIN  Take 1 tablet (600 mg total) by mouth every 6 (six) hours.     oxyCODONE-acetaminophen 5-325 MG per tablet  Commonly known as:  PERCOCET/ROXICET  Take 1-2 tablets by mouth every 4 (four) hours as needed.     PRENATAL VITAMIN PO  Take 1 tablet by mouth daily.       Condition: improved Instructions: refer to practice specific booklet and printed AVS instructions Discharge to: home     Follow-up Information   Follow up with [facility] In 6 weeks.   Specialty:  Obstetrics and Gynecology   Contact information:   [address] [place] [address] [phone]      Newborn Data: Live born  Information for the patient's newborn:  Trostle, Girl Ersie [[id number]]  female ; [id number] 9, ; weight ; 7lb 4.6oz Home with mother.  Zaliah Wissner P 08/27/2012, 9:19 AM

## 2012-08-27 NOTE — Progress Notes (Signed)
Patient was referred for history of depression/anxiety. * Referral screened out by Clinical Social Worker because none of the following criteria appear to apply:  ~ History of anxiety/depression during this pregnancy, or of post-partum depression.  ~ Diagnosis of anxiety and/or depression within last 3 years, as per pt.  ~ History of depression due to pregnancy loss/loss of child  OR * Patient's symptoms currently being treated with medication and/or therapy.  Please contact the Clinical Social Worker if needs arise, or by the patient's request.  

## 2012-11-11 ENCOUNTER — Other Ambulatory Visit: Payer: Self-pay

## 2013-11-07 ENCOUNTER — Encounter (HOSPITAL_COMMUNITY): Payer: Self-pay

## 2015-02-18 ENCOUNTER — Ambulatory Visit (INDEPENDENT_AMBULATORY_CARE_PROVIDER_SITE_OTHER): Payer: Commercial Managed Care - PPO

## 2015-02-18 ENCOUNTER — Ambulatory Visit (INDEPENDENT_AMBULATORY_CARE_PROVIDER_SITE_OTHER): Payer: Commercial Managed Care - PPO | Admitting: Physician Assistant

## 2015-02-18 VITALS — BP 134/82 | HR 93 | Temp 98.2°F | Resp 20 | Ht 63.0 in | Wt 120.0 lb

## 2015-02-18 DIAGNOSIS — M25471 Effusion, right ankle: Secondary | ICD-10-CM

## 2015-02-18 DIAGNOSIS — S92301A Fracture of unspecified metatarsal bone(s), right foot, initial encounter for closed fracture: Secondary | ICD-10-CM | POA: Diagnosis not present

## 2015-02-18 MED ORDER — HYDROCODONE-ACETAMINOPHEN 5-325 MG PO TABS: 1.0000 | ORAL_TABLET | Freq: Four times a day (QID) | ORAL | Status: DC | PRN

## 2015-02-18 MED ORDER — NAPROXEN SODIUM 550 MG PO TABS: 550.0000 mg | ORAL_TABLET | Freq: Two times a day (BID) | ORAL | Status: DC

## 2015-02-18 NOTE — Patient Instructions (Signed)
Because you received an x-ray today, you will receive an invoice from [facility]. Please contact [facility] at [phone] with questions or concerns regarding your invoice. Our billing staff will not be able to assist you with those questions. °

## 2015-02-18 NOTE — Progress Notes (Signed)
02/19/2015 6:47 PM   DOB: [birth year] / MRN: [id number]  SUBJECTIVE:  Kylie Phillips is a 35 y.o. female presenting for severe right ankle/foot pain and swelling.  Reports she inverted her ankle earlier today and is having difficulty walking.  Has tried icing.    She has No Known Allergies.   She  has a past medical history of Anxiety; Infection; and Anemia.    She  reports that she has never smoked. She has never used smokeless tobacco. She reports that she does not drink alcohol or use illicit drugs. She  reports that she currently engages in sexual activity and has had female partners. The patient  has past surgical history that includes Wisdom tooth extraction.  Her family history includes Anemia in her mother; Anxiety disorder in her mother; Asthma in her mother; Bipolar disorder in her paternal aunt; Congestive Heart Failure in her paternal grandfather; Diabetes in her maternal grandmother; Heart attack in her maternal grandfather and paternal grandfather; Hypertension in her maternal grandmother; Kidney disease in her maternal grandfather; Migraines in her father; Skin cancer in her maternal grandfather.  Review of Systems  Constitutional: Negative for fever and chills.  Eyes: Negative for blurred vision.  Respiratory: Negative for cough and shortness of breath.   Cardiovascular: Negative for chest pain.  Gastrointestinal: Negative for nausea and abdominal pain.  Genitourinary: Negative for dysuria, urgency and frequency.  Musculoskeletal: Positive for joint pain and falls. Negative for myalgias.  Skin: Negative for rash.  Neurological: Negative for dizziness, tingling and headaches.  Psychiatric/Behavioral: Negative for depression. The patient is not nervous/anxious.     Problem list and medications reviewed and updated by myself where necessary, and exist elsewhere in the encounter.   OBJECTIVE:  BP 134/82 mmHg  Pulse 93  Temp(Src) 98.2 F (36.8 C) (Oral)  Resp 20  Ht   (1.6 m)  Wt 120 lb (54.432 kg)  BMI 21.26 kg/m2  SpO2 99%  LMP 02/01/2015  Breastfeeding? No  Physical Exam  Constitutional: She is oriented to person, place, and time. She appears well-developed.  Eyes: EOM are normal. Pupils are equal, round, and reactive to light.  Cardiovascular: Normal rate.   Pulmonary/Chest: Effort normal.  Abdominal: She exhibits no distension.  Musculoskeletal: Normal range of motion.       Right ankle: She exhibits swelling. She exhibits no ecchymosis and no deformity. Tenderness. AITFL tenderness found. No lateral malleolus, no medial malleolus, no CF ligament and no proximal fibula tenderness found.       Left ankle: Normal.       Feet:  Neurological: She is alert and oriented to person, place, and time. No cranial nerve deficit.  Skin: Skin is warm and dry. She is not diaphoretic.  Psychiatric: She has a normal mood and affect.  Vitals reviewed.   No results found for this or any previous visit (from the past 72 hour(s)).  Dg Ankle Complete Right  02/18/2015  CLINICAL DATA:  Twisted ankle, fall EXAM: RIGHT ANKLE - COMPLETE 3+ VIEW COMPARISON:  None. FINDINGS: No fracture or dislocation is seen in the ankle. The ankle mortise is intact. Fracture involving the base of the 5th metatarsal. Overlying lateral soft tissue swelling. IMPRESSION: Fracture involving the base of the 5th metatarsal. Electronically Signed   By: Charline Bills M.D.   On: 02/18/2015 17:48   Dg Foot Complete Right  02/18/2015  CLINICAL DATA:  Fall, ankle pain/injury EXAM: RIGHT FOOT COMPLETE - 3+ VIEW COMPARISON:  None. FINDINGS: Nondisplaced  fracture involving the base of the 5th metatarsal. The joint spaces are preserved. Overlying mild soft tissue swelling along the lateral midfoot. IMPRESSION: Nondisplaced fracture involving the base of the 5th metatarsal. Electronically Signed   By: Charline Bills M.D.   On: 02/18/2015 17:47     ASSESSMENT AND PLAN  Kylie Phillips was seen today for  ankle injury.  Diagnoses and all orders for this visit:  Ankle swelling, right -     DG Ankle Complete Right; Future -     DG Foot Complete Right  Fracture of fifth metatarsal bone, right, closed, initial encounter: Posterior stirrup splint applied in the office and patient made non-weight bearing.  Her fracture is non-displaced.  Will get her to orthopedics in the next few days.   -     AMB referral to orthopedics -     HYDROcodone-acetaminophen (NORCO) 5-325 MG tablet; Take 1 tablet by mouth every 6 (six) hours as needed for severe pain. For severe pain only. -     naproxen sodium (ANAPROX DS) 550 MG tablet; Take 1 tablet (550 mg total) by mouth 2 (two) times daily with a meal.    The patient was advised to call or return to clinic if she does not see an improvement in symptoms or to seek the care of the closest emergency department if she worsens with the above plan.   Kylie Phillips, MHS, PA-C [facility] 02/19/2015 6:47 PM

## 2016-01-07 NOTE — L&D Delivery Note (Addendum)
Delivery Note  First Stage: Labor onset: 08/26/16 @1100am  Augmentation : AROM  Analgesia /Anesthesia intrapartum: epidural  AROM at 1752pm - clear fluid  Second Stage: Complete dilation at 08/27/16 @ 0033 am Onset of pushing at 0040am FHR second stage - Category 2  Delivery of the fetal head in Direct OA position at 0050am using Ritgen's maneuver No effective contraction palpated after fetal head out First maneuver: McRoberts Second Maneuver: suprapubic pressure  Fetal head and body restituted to ROT, and with suprapubic pressure the anterior shoulder was relieved  Delivery of a viable female "Titus" at [employer] by Carlean Jews, CNM in ROT position NICU team in attendance  No nuchal cord Cord milked, then double clamped and cut by CNM Cord blood sample collected   Arterial cord blood sample - attempted, but not collected   Third Stage: Placenta delivered via Tomasa Blase intact with 3 VC @ 0059 Placenta disposition: hospital disposal  Uterine tone firm / bleeding minimal   1st laceration identified  Anesthesia for repair: Epidural and Lidocaine 1% Repair: 3.0 vicryl  Est. Blood Loss (mL):  Complications: none  Mom to postpartum.  Baby to Couplet care / Skin to Skin.  Newborn: Birth Weight: 8#13oz (4000grams) Apgar Scores: 8, 9 Feeding planned: Breast  Dr. Juliene Pina updated after delivery  Carlean Jews, MSN, [facility]

## 2016-01-16 ENCOUNTER — Ambulatory Visit (INDEPENDENT_AMBULATORY_CARE_PROVIDER_SITE_OTHER): Payer: Commercial Managed Care - PPO | Admitting: Physician Assistant

## 2016-01-16 VITALS — BP 118/76 | HR 118 | Temp 98.0°F | Resp 16 | Ht 62.0 in | Wt 123.0 lb

## 2016-01-16 DIAGNOSIS — H6982 Other specified disorders of Eustachian tube, left ear: Secondary | ICD-10-CM

## 2016-01-16 NOTE — Patient Instructions (Addendum)
Try switching from Claritin to Zyrtec or Allegra. Continue the Flonase. You can try Afrin, but do not use it for more than 2 days. Stay well hydrated. Chewing gum and yawning can help, too.    IF you received an x-ray today, you will receive an invoice from [facility]. Please contact [facility] at [phone] with questions or concerns regarding your invoice.   IF you received labwork today, you will receive an invoice from [facility]. Please contact [facility] at [phone] with questions or concerns regarding your invoice.   Our billing staff will not be able to assist you with questions regarding bills from these companies.  You will be contacted with the lab results as soon as they are available. The fastest way to get your results is to activate your My Chart account. Instructions are located on the last page of this paperwork. If you have not heard from us regarding the results in 2 weeks, please contact this office.

## 2016-01-16 NOTE — Progress Notes (Signed)
     Patient ID: Lessie DingsSarah Spadoni, female    DOB: [birth year], 36 y.o.   MRN: [id number]  PCP: Sissy HoffSWAYNE,DAVID W, MD  Chief Complaint  Patient presents with  . ear problem    left, x 1 day     Subjective:   Presents for evaluation of left ear pain.  Pt is an [redacted] wks pregnant 36yo female who presents today with left ear pain. She states that she had cold like symptoms from Friday to Monday associated with cough, congestion, post nasal drip, and sore throat. She states that these symptoms have since improved. Yesterday, she coughed and felt a sudden increased pressure in her left ear that has still not gone away. The pressure is associated with dull pain and mildly decreased hearing. She has tried Flonase, Robitussin, and Claritin with no relief. She denies ear discharge.   Review of Systems In addition to that mentioned in HPI above: Cont: Denies fever, chills, fatigue. HEENT: Admits to post nasal drainage, sore throat,  Pulm: Admits to a dry cough. Denies SOB. CV: Denies Chest pain or palpitations. Abd: Admits to occasional nausea due to preganacy. Denies vomiting.     Patient Active Problem List   Diagnosis Date Noted  . NSVD (normal spontaneous vaginal delivery) 08/27/2012  . Anemia 08/27/2012  . Anxiety 01/07/2012  . Congenital heart disease 01/07/2012     Prior to Admission medications   Medication Sig Start Date End Date Taking? Authorizing Provider  fluticasone (FLONASE) 50 MCG/ACT nasal spray Place into both nostrils daily.   Yes Historical Provider, MD  loratadine-pseudoephedrine (CLARITIN-D 24-HOUR) 10-240 MG 24 hr tablet Take 1 tablet by mouth daily.   Yes Historical Provider, MD  Prenatal MV & Min w/FA-DHA (PRENATAL ADULT GUMMY/DHA/FA) 0.4-25 MG CHEW Chew by mouth.   Yes Historical Provider, MD     No Known Allergies     Objective:  Physical Exam HEENT: Throat is nonerythematous, no exudates. No tonsillar hypertrophy. Ear canals are clear and TMs are intact, non  bulging or retracted. Possible fluid behind left TM. Pulm: Good respiratory effort. CTAB, no wheezes, rales, or rhonchi. CV: RRR, no M/R/G. Abd: Nontender. + BS x 4 quadrants. Lymph: No lymphadenopathy.      Assessment & Plan:   1. Dysfunction of left eustachian tube Pt advised to switch from Claritin to Zyrtec or Allegra, to drink plenty of water, and to try chewing gum or yawning to help equalize the pressure. Pt advised to contact provider if symptoms do not alleviate or if new or worrisome symptoms arise.   Georgiana SpinnerHannah Bradley Sritha Chauncey, PA-S

## 2016-01-16 NOTE — Progress Notes (Signed)
     Patient ID: Kylie DingsSarah Phillips, female    DOB: Oct 15, 1980, 36 y.o.   MRN: [id number]  PCP: Kylie HoffSWAYNE,DAVID W, MD  Chief Complaint  Patient presents with  . ear problem    left, x 1 day     Subjective:   Presents for evaluation of discomfort in the LEFT ear that began yesterday.  [redacted] weeks pregnant, 3rd pregnancy. Cough, congestion, post-nasal drainage. Seem to be improving, but yesterday while coughing her LEFT ear popped. Loratadine and Robitussin. Describes a pulsing in the ear.  No symptoms in the RIGHT ear.  Remote history of ETD in college..    Review of Systems No fever, chills. Some nausea, related to pregnancy, not worse with these symptoms. No CP, SOB, HA, dizziness.    Patient Active Problem List   Diagnosis Date Noted  . NSVD (normal spontaneous vaginal delivery) 08/27/2012  . Anemia 08/27/2012  . Anxiety 01/07/2012  . Congenital heart disease 01/07/2012     Prior to Admission medications   Medication Sig Start Date End Date Taking? Authorizing Provider  fluticasone (FLONASE) 50 MCG/ACT nasal spray Place into both nostrils daily.   Yes Historical Provider, MD  loratadine-pseudoephedrine (CLARITIN-D 24-HOUR) 10-240 MG 24 hr tablet Take 1 tablet by mouth daily.   Yes Historical Provider, MD  Prenatal MV & Min Phillips/FA-DHA (PRENATAL ADULT GUMMY/DHA/FA) 0.4-25 MG CHEW Chew by mouth.   Yes Historical Provider, MD     No Known Allergies     Objective:  Physical Exam  Constitutional: She is oriented to person, place, and time. She appears well-developed and well-nourished. She is active and cooperative. No distress.  BP 118/76   Pulse (!) 118   Temp 98 F (36.7 C) (Oral)   Resp 16   Ht 5\' 2"  (1.575 m)   Wt 123 lb (55.8 kg)   LMP 11/20/2015 (Exact Date)   SpO2 100%   BMI 22.50 kg/m    HENT:  Head: Normocephalic and atraumatic.  Right Ear: Hearing, tympanic membrane, external ear and ear canal normal.  Left Ear: Hearing, external ear and ear canal  normal.  Ears:  Eyes: Conjunctivae are normal.  Pulmonary/Chest: Effort normal.  Neurological: She is alert and oriented to person, place, and time.  Psychiatric: She has a normal mood and affect. Her speech is normal and behavior is normal.           Assessment & Plan:   1. Dysfunction of left eustachian tube Continue oral antihistamine, consider change to fexofenadine or cetirizine. Continue fluticasone NS. Hydrate. OK to use Afrin x 2 days, not longer. Anticipatory guidance provided.   Fernande Brashelle S. Diedra Sinor, PA-C Physician Assistant-Certified Primary Care at [facility]

## 2016-02-01 LAB — OB RESULTS CONSOLE HIV ANTIBODY (ROUTINE TESTING): HIV: NONREACTIVE

## 2016-02-01 LAB — OB RESULTS CONSOLE GC/CHLAMYDIA
CHLAMYDIA, DNA PROBE: NEGATIVE
GC PROBE AMP, GENITAL: NEGATIVE

## 2016-02-01 LAB — OB RESULTS CONSOLE RUBELLA ANTIBODY, IGM: Rubella: IMMUNE

## 2016-02-01 LAB — OB RESULTS CONSOLE HEPATITIS B SURFACE ANTIGEN: HEP B S AG: NEGATIVE

## 2016-06-10 LAB — OB RESULTS CONSOLE RPR: RPR: NONREACTIVE

## 2016-07-28 LAB — OB RESULTS CONSOLE GBS: GBS: NEGATIVE

## 2016-08-26 ENCOUNTER — Inpatient Hospital Stay (HOSPITAL_COMMUNITY)
Admission: AD | Admit: 2016-08-26 | Discharge: 2016-08-28 | DRG: 775 | Disposition: A | Discharge status: Home / Self Care | Payer: Commercial Managed Care - PPO | Source: Ambulatory Visit | Attending: Obstetrics and Gynecology | Admitting: Obstetrics and Gynecology

## 2016-08-26 ENCOUNTER — Inpatient Hospital Stay (HOSPITAL_COMMUNITY): Payer: Commercial Managed Care - PPO | Admitting: Anesthesiology

## 2016-08-26 ENCOUNTER — Encounter (HOSPITAL_COMMUNITY): Payer: Self-pay | Admitting: Obstetrics

## 2016-08-26 DIAGNOSIS — F419 Anxiety disorder, unspecified: Secondary | ICD-10-CM | POA: Diagnosis present

## 2016-08-26 DIAGNOSIS — O99344 Other mental disorders complicating childbirth: Principal | ICD-10-CM | POA: Diagnosis present

## 2016-08-26 DIAGNOSIS — Z3A40 40 weeks gestation of pregnancy: Secondary | ICD-10-CM | POA: Diagnosis not present

## 2016-08-26 DIAGNOSIS — Z3493 Encounter for supervision of normal pregnancy, unspecified, third trimester: Secondary | ICD-10-CM | POA: Diagnosis present

## 2016-08-26 LAB — CBC
HEMATOCRIT: 34.1 % — AB (ref 36.0–46.0)
HEMOGLOBIN: 11.5 g/dL — AB (ref 12.0–15.0)
MCH: 31 pg (ref 26.0–34.0)
MCHC: 33.7 g/dL (ref 30.0–36.0)
MCV: 91.9 fL (ref 78.0–100.0)
Platelets: 173 10*3/uL (ref 150–400)
RBC: 3.71 MIL/uL — ABNORMAL LOW (ref 3.87–5.11)
RDW: 15.9 % — AB (ref 11.5–15.5)
WBC: 8.1 10*3/uL (ref 4.0–10.5)

## 2016-08-26 LAB — TYPE AND SCREEN
ABO/RH(D): B POS
Antibody Screen: NEGATIVE

## 2016-08-26 LAB — ABO/RH: ABO/RH(D): B POS

## 2016-08-26 MED ORDER — PHENYLEPHRINE 40 MCG/ML (10ML) SYRINGE FOR IV PUSH (FOR BLOOD PRESSURE SUPPORT)
80.0000 ug | PREFILLED_SYRINGE | INTRAVENOUS | Status: DC | PRN
  Filled 2016-08-26: qty 5
  Filled 2016-08-26: qty 10

## 2016-08-26 MED ORDER — LACTATED RINGERS IV SOLN
INTRAVENOUS | Status: DC
  Administered 2016-08-26: 16:00:00 via INTRAVENOUS

## 2016-08-26 MED ORDER — EPHEDRINE 5 MG/ML INJ
10.0000 mg | INTRAVENOUS | Status: DC | PRN
  Filled 2016-08-26: qty 2

## 2016-08-26 MED ORDER — OXYTOCIN BOLUS FROM INFUSION
500.0000 mL | Freq: Once | INTRAVENOUS | Status: AC
  Administered 2016-08-27: 500 mL via INTRAVENOUS

## 2016-08-26 MED ORDER — LIDOCAINE HCL (PF) 1 % IJ SOLN
30.0000 mL | INTRAMUSCULAR | Status: DC | PRN
  Administered 2016-08-27: 30 mL via SUBCUTANEOUS
  Filled 2016-08-26: qty 30

## 2016-08-26 MED ORDER — LACTATED RINGERS IV SOLN: 500.0000 mL | Freq: Once | INTRAVENOUS | Status: DC

## 2016-08-26 MED ORDER — ACETAMINOPHEN 325 MG PO TABS
650.0000 mg | ORAL_TABLET | ORAL | Status: DC | PRN
  Administered 2016-08-27: 650 mg via ORAL
  Filled 2016-08-26: qty 2

## 2016-08-26 MED ORDER — FENTANYL 2.5 MCG/ML BUPIVACAINE 1/10 % EPIDURAL INFUSION (WH - ANES)
14.0000 mL/h | INTRAMUSCULAR | Status: DC | PRN
  Administered 2016-08-26: 14 mL/h via EPIDURAL
  Filled 2016-08-26: qty 100

## 2016-08-26 MED ORDER — LACTATED RINGERS IV SOLN: 500.0000 mL | INTRAVENOUS | Status: DC | PRN

## 2016-08-26 MED ORDER — LIDOCAINE HCL (PF) 1 % IJ SOLN
INTRAMUSCULAR | Status: DC | PRN
  Administered 2016-08-26 (×2): 4 mL via EPIDURAL

## 2016-08-26 MED ORDER — PHENYLEPHRINE 40 MCG/ML (10ML) SYRINGE FOR IV PUSH (FOR BLOOD PRESSURE SUPPORT)
80.0000 ug | PREFILLED_SYRINGE | INTRAVENOUS | Status: DC | PRN
  Filled 2016-08-26: qty 5

## 2016-08-26 MED ORDER — DIPHENHYDRAMINE HCL 50 MG/ML IJ SOLN: 12.5000 mg | INTRAMUSCULAR | Status: DC | PRN

## 2016-08-26 MED ORDER — OXYTOCIN 40 UNITS IN LACTATED RINGERS INFUSION - SIMPLE MED
2.5000 [IU]/h | INTRAVENOUS | Status: DC
  Filled 2016-08-26: qty 1000

## 2016-08-26 MED ORDER — ONDANSETRON HCL 4 MG/2ML IJ SOLN: 4.0000 mg | Freq: Four times a day (QID) | INTRAMUSCULAR | Status: DC | PRN

## 2016-08-26 MED ORDER — SOD CITRATE-CITRIC ACID 500-334 MG/5ML PO SOLN
30.0000 mL | ORAL | Status: DC | PRN
Start: 2016-08-26 — End: 2016-08-27

## 2016-08-26 NOTE — Progress Notes (Addendum)
S:  Pt. Feeling stronger contractions - tearful at times due to the pain, plus her anxiety       Pt. Requesting epidural   O:  VS: Blood pressure 106/74, pulse 89, temperature 98.9 F (37.2 C), temperature source Oral, resp. rate 18, height 5\' 3"  (1.6 m), weight 69.9 kg (154 lb), last menstrual period 11/20/2015.        FHR : baseline 125 bpm / variability moderate / accelerations + / occasional early decelerations        Toco: contractions every 2-4 minutes / moderate        Cervix : Dilation: 6 Effacement (%): 80 Station: -1 Presentation: Vertex Exam by:: Sigmon, CNM        Membranes: AROM - clear   A: Active labor     FHR category 1  P: Epidural now     Continue expectant management     Reassess 1-2 hours    Carlean Jews, MSN, CNM [facility]

## 2016-08-26 NOTE — Progress Notes (Signed)
S:  Pt. Anxious and worried about not progressing - on birthing ball   O:  VS: Blood pressure 106/74, pulse 89, temperature 98.9 F (37.2 C), temperature source Oral, resp. rate 18, height 5\' 3"  (1.6 m), weight 69.9 kg (154 lb), last menstrual period 11/20/2015.        FHR : baseline 135 bpm / variability moderate / accelerations + / no decelerations        Toco: contractions every 2-5 minutes / mild        Cervix : deferred        Membranes: AROM - clear fluid  A: Latent labor     FHR category 1  P: Continue ambulation and position changes     Pt. Wants to avoid Pitocin if possible     Continuous labor support from husband and friend     Reassess in 1-2 weeks     Carlean Jews, MSN, CNM [facility]

## 2016-08-26 NOTE — Anesthesia Procedure Notes (Signed)
Epidural Patient location during procedure: OB Start time: 08/26/2016 10:10 PM  Staffing Anesthesiologist: Karna Christmas P Performed: anesthesiologist   Preanesthetic Checklist Completed: patient identified, site marked, pre-op evaluation, timeout performed, IV checked, risks and benefits discussed and monitors and equipment checked  Epidural Patient position: sitting Prep: DuraPrep Patient monitoring: heart rate, cardiac monitor, continuous pulse ox and blood pressure Approach: midline Location: L4-L5 Injection technique: LOR air  Needle:  Needle type: Tuohy  Needle gauge: 17 G Needle length: 9 cm Needle insertion depth: 6 cm Catheter type: closed end flexible Catheter size: 19 Gauge Catheter at skin depth: 11 cm Test dose: negative and Other  Assessment Events: blood not aspirated, injection not painful, no injection resistance and negative IV test  Additional Notes Informed consent obtained prior to proceeding including risk of failure, 1% risk of PDPH, risk of minor discomfort and bruising.  Discussed rare but serious complications.  Discussed alternatives to epidural analgesia and patient desires to proceed.  Timeout performed pre-procedure verifying patient name, procedure, and platelet count.  Patient tolerated procedure well. Reason for block:procedure for pain

## 2016-08-26 NOTE — Anesthesia Preprocedure Evaluation (Signed)
Anesthesia Evaluation  Patient identified by MRN, date of birth, ID band Patient awake    Reviewed: Allergy & Precautions, H&P , NPO status , Patient's Chart, lab work & pertinent test results  History of Anesthesia Complications Negative for: history of anesthetic complications  Airway Mallampati: II  TM Distance: >3 FB Neck ROM: full    Dental no notable dental hx. (+) Teeth Intact   Pulmonary neg pulmonary ROS,    Pulmonary exam normal breath sounds clear to auscultation       Cardiovascular negative cardio ROS Normal cardiovascular exam Rhythm:regular Rate:Normal     Neuro/Psych negative neurological ROS  negative psych ROS   GI/Hepatic negative GI ROS, Neg liver ROS,   Endo/Other  negative endocrine ROS  Renal/GU negative Renal ROS  negative genitourinary   Musculoskeletal   Abdominal   Peds  Hematology  (+) anemia ,   Anesthesia Other Findings   Reproductive/Obstetrics (+) Pregnancy                             Anesthesia Physical  Anesthesia Plan  ASA: II  Anesthesia Plan: Epidural   Post-op Pain Management:    Induction:   PONV Risk Score and Plan:   Airway Management Planned:   Additional Equipment:   Intra-op Plan:   Post-operative Plan:   Informed Consent: I have reviewed the patients History and Physical, chart, labs and discussed the procedure including the risks, benefits and alternatives for the proposed anesthesia with the patient or authorized representative who has indicated his/her understanding and acceptance.       Plan Discussed with:   Anesthesia Plan Comments:         Anesthesia Quick Evaluation  

## 2016-08-26 NOTE — Progress Notes (Addendum)
S:  Pt. Ambulating and states contractions are starting to get more intense - would like a repeat cervical exam and possibly AROM     Would like to avoid Pitocin if possible  O:  VS: Blood pressure 104/63, pulse (!) 105, temperature 98.9 F (37.2 C), temperature source Oral, resp. rate 20, height 5\' 3"  (1.6 m), weight 69.9 kg (154 lb), last menstrual period 11/20/2015.        FHR : baseline 145 bpm / variability moderate / accelerations + / no decelerations        Toco: contractions every 3-5 minutes / mild         Cervix : Dilation: 4.5 Presentation: Vertex Exam by:: meredith sigmon cnm        Membranes: AROM - for clear fluid with bloody show  A: Latent labor     FHR category 1    GBS negative   P: Birth ball, ambulation, position changes      Epidural PRN     Reassess in 1-2 hours      Anticipate NSVD   Carlean Jews, MSN, CNM [facility]

## 2016-08-26 NOTE — Anesthesia Pain Management Evaluation Note (Signed)
  CRNA Pain Management Visit Note  Patient: Kylie Phillips, 36 y.o., female  "Hello I am a member of the anesthesia team at [facility]. We have an anesthesia team available at all times to provide care throughout the hospital, including epidural management and anesthesia for C-section. I don't know your plan for the delivery whether it a natural birth, water birth, IV sedation, nitrous supplementation, doula or epidural, but we want to meet your pain goals."   1.Was your pain managed to your expectations on prior hospitalizations?   Yes   2.What is your expectation for pain management during this hospitalization?     Epidural  3.How can we help you reach that goal?   Record the patient's initial score and the patient's pain goal.   Pain: 5  Pain Goal: 7 [facility] wants you to be able to say your pain was always managed very well.  Kylie Phillips 08/26/2016

## 2016-08-26 NOTE — H&P (Signed)
OB ADMISSION/ HISTORY & PHYSICAL:  Admission Date: 08/26/2016  3:24 PM  Admit Diagnosis: Labor at 40 weeks   Kylie Phillips is a 36 y.o. female G3P2 at 65 weeks presenting for labor.  She had her membranes swept yesterday and has had irregular contractions since last night.  At 11am contractions became more intense at were every 4-5 minutes apart with bloody show. She was seen in the office today, and was 4cm/70%/-2 and membranes swept again.    Prenatal History: [id number]   EDC: 08/26/16  Prenatal care at [facility] since 10 weeks Primary Ob Provider: CNM management / T. Fredric Mare, CNM Prenatal course complicated by: - Choroid plexus cysts - resolved by 3rd trimester - FOB has hx of congenital heart defect s/p [facility] consult on 4/30 Fetal echo WNL - Anxiety - not on medication - Glucose intolerance, passed 3 hr GTT  Prenatal Labs: ABO, Rh:  B Pos Antibody:  Negative Rubella:   Immune RPR:   NR HBsAg:   Negative  HIV:   NR GTT: 1 hr GTT abnormal, 3 hr GTT WNL GBS:   Negative  [place] on 8/10: EFW 7#15oz   Medical / Surgical History :  Past medical history:  Past Medical History:  Diagnosis Date  . Anemia    During previous pregnancy;needed FeSO4 supp  . Anxiety    No meds  . Infection    Yeast x 1     Past surgical history:  Past Surgical History:  Procedure Laterality Date  . WISDOM TOOTH EXTRACTION     All are removed    Family History:  Family History  Problem Relation Age of Onset  . Heart attack Maternal Grandfather        Deceased  . Heart attack Paternal Grandfather   . Congestive Heart Failure Paternal Grandfather        Deceased  . Hypertension Maternal Grandmother   . Diabetes Maternal Grandmother   . Anemia Mother   . Asthma Mother   . Kidney disease Maternal Grandfather        Dialysis  . Migraines Father   . Skin cancer Maternal Grandfather   . Anxiety disorder Mother   . Bipolar disorder Paternal Aunt      Social History:   reports that she has never smoked. She has never used smokeless tobacco. She reports that she does not drink alcohol or use drugs.   Allergies: Patient has no known allergies.    Current Medications at time of admission:  Prior to Admission medications   Medication Sig Start Date End Date Taking? Authorizing Provider  fluticasone (FLONASE) 50 MCG/ACT nasal spray Place into both nostrils daily.    [provider]  loratadine-pseudoephedrine (CLARITIN-D 24-HOUR) 10-240 MG 24 hr tablet Take 1 tablet by mouth daily.    [provider]  Prenatal MV & Min w/FA-DHA (PRENATAL ADULT GUMMY/DHA/FA) 0.4-25 MG CHEW Chew by mouth.    [provider]     Review of Systems: Active FM onset of ctx @ 11am currently every 7 minutes No LOF  / SROM  bloody show present  Physical Exam:  VS: Blood pressure 107/69, pulse (!) 119, temperature 98.3 F (36.8 C), temperature source Axillary, resp. rate 18, last menstrual period 11/20/2015.  General: alert and oriented, appears anxious, tearful at times Heart: RRR Lungs: Clear lung fields Abdomen: Gravid, soft and non-tender, non-distended / uterus: gravid, non-tender Leopold's: EFW 8# Extremities: trace pedal edema  Genitalia / VE:   4cm/70%/-2/vtx  FHR: baseline rate 125 bpm / variability moderate / accelerations + / no decelerations TOCO: every 3- 5 minutes  Assessment: [redacted] weeks gestation Latent stage of labor FHR category 1 GBS negative    Plan:  1. Admit to [employer]   - Routine labor and delivery orders   - Pain management: nitrous oxide vs. Epidural  2. GBS Negative:   - No prophylaxis indicated  3. Postpartum:   - Breast   - Contraception:usnure  4. Anticipate MOD: NSVD   - Proven pelvis: 8#2oz   Dr. Juliene Pina notified of admission / plan of care  Carlean Jews, MSN, [facility]

## 2016-08-27 ENCOUNTER — Encounter (HOSPITAL_COMMUNITY): Payer: Self-pay

## 2016-08-27 LAB — CBC
HEMATOCRIT: 30.4 % — AB (ref 36.0–46.0)
Hemoglobin: 10.5 g/dL — ABNORMAL LOW (ref 12.0–15.0)
MCH: 31.5 pg (ref 26.0–34.0)
MCHC: 34.5 g/dL (ref 30.0–36.0)
MCV: 91.3 fL (ref 78.0–100.0)
Platelets: 153 10*3/uL (ref 150–400)
RBC: 3.33 MIL/uL — ABNORMAL LOW (ref 3.87–5.11)
RDW: 15.7 % — AB (ref 11.5–15.5)
WBC: 13.5 10*3/uL — ABNORMAL HIGH (ref 4.0–10.5)

## 2016-08-27 LAB — RPR: RPR Ser Ql: NONREACTIVE

## 2016-08-27 MED ORDER — COCONUT OIL OIL: 1.0000 "application " | TOPICAL_OIL | Status: DC | PRN

## 2016-08-27 MED ORDER — SENNOSIDES-DOCUSATE SODIUM 8.6-50 MG PO TABS
2.0000 | ORAL_TABLET | ORAL | Status: DC
  Administered 2016-08-28: 2 via ORAL
  Filled 2016-08-27: qty 2

## 2016-08-27 MED ORDER — BENZOCAINE-MENTHOL 20-0.5 % EX AERO
1.0000 "application " | INHALATION_SPRAY | CUTANEOUS | Status: DC | PRN
  Administered 2016-08-27 (×2): 1 via TOPICAL
  Filled 2016-08-27 (×2): qty 56

## 2016-08-27 MED ORDER — IBUPROFEN 600 MG PO TABS
600.0000 mg | ORAL_TABLET | Freq: Four times a day (QID) | ORAL | Status: DC
  Administered 2016-08-27 – 2016-08-28 (×6): 600 mg via ORAL
  Filled 2016-08-27 (×6): qty 1

## 2016-08-27 MED ORDER — DIPHENHYDRAMINE HCL 25 MG PO CAPS: 25.0000 mg | ORAL_CAPSULE | Freq: Four times a day (QID) | ORAL | Status: DC | PRN

## 2016-08-27 MED ORDER — ONDANSETRON HCL 4 MG/2ML IJ SOLN: 4.0000 mg | INTRAMUSCULAR | Status: DC | PRN

## 2016-08-27 MED ORDER — ERYTHROMYCIN 5 MG/GM OP OINT
TOPICAL_OINTMENT | OPHTHALMIC | Status: AC
  Filled 2016-08-27: qty 1

## 2016-08-27 MED ORDER — MAGNESIUM OXIDE 400 (241.3 MG) MG PO TABS
400.0000 mg | ORAL_TABLET | Freq: Every day | ORAL | Status: DC
  Administered 2016-08-27: 400 mg via ORAL
  Filled 2016-08-27 (×3): qty 1

## 2016-08-27 MED ORDER — WITCH HAZEL-GLYCERIN EX PADS
1.0000 "application " | MEDICATED_PAD | CUTANEOUS | Status: DC | PRN
  Administered 2016-08-27 (×2): 1 via TOPICAL

## 2016-08-27 MED ORDER — SIMETHICONE 80 MG PO CHEW: 80.0000 mg | CHEWABLE_TABLET | ORAL | Status: DC | PRN

## 2016-08-27 MED ORDER — DIBUCAINE 1 % RE OINT: 1.0000 "application " | TOPICAL_OINTMENT | RECTAL | Status: DC | PRN

## 2016-08-27 MED ORDER — TETANUS-DIPHTH-ACELL PERTUSSIS 5-2.5-18.5 LF-MCG/0.5 IM SUSP: 0.5000 mL | Freq: Once | INTRAMUSCULAR | Status: DC

## 2016-08-27 MED ORDER — ZOLPIDEM TARTRATE 5 MG PO TABS: 5.0000 mg | ORAL_TABLET | Freq: Every evening | ORAL | Status: DC | PRN

## 2016-08-27 MED ORDER — ONDANSETRON HCL 4 MG PO TABS: 4.0000 mg | ORAL_TABLET | ORAL | Status: DC | PRN

## 2016-08-27 MED ORDER — PRENATAL MULTIVITAMIN CH
1.0000 | ORAL_TABLET | Freq: Every day | ORAL | Status: DC
  Administered 2016-08-27 – 2016-08-28 (×2): 1 via ORAL
  Filled 2016-08-27 (×3): qty 1

## 2016-08-27 MED ORDER — POLYSACCHARIDE IRON COMPLEX 150 MG PO CAPS
150.0000 mg | ORAL_CAPSULE | Freq: Every day | ORAL | Status: DC
  Administered 2016-08-27 – 2016-08-28 (×2): 150 mg via ORAL
  Filled 2016-08-27 (×2): qty 1

## 2016-08-27 MED ORDER — ACETAMINOPHEN 325 MG PO TABS
650.0000 mg | ORAL_TABLET | ORAL | Status: DC | PRN
  Administered 2016-08-27: 650 mg via ORAL
  Filled 2016-08-27: qty 2

## 2016-08-27 NOTE — Anesthesia Postprocedure Evaluation (Signed)
Anesthesia Post Note  Patient: Kylie Phillips  Procedure(s) Performed: * No procedures listed *     Patient location during evaluation: Mother Baby Anesthesia Type: Epidural Level of consciousness: awake and alert and oriented Pain management: satisfactory to patient Vital Signs Assessment: post-procedure vital signs reviewed and stable Respiratory status: spontaneous breathing and nonlabored ventilation Cardiovascular status: stable Postop Assessment: no headache, no backache, no signs of nausea or vomiting, adequate PO intake and patient able to bend at knees (patient up walking) Anesthetic complications: no    Last Vitals:  Vitals:   08/27/16 0410 08/27/16 0815  BP: (!) 95/58 (!) 97/52  Pulse: 95 78  Resp: 18 18  Temp: 37.2 C 37.1 C  SpO2:  99%    Last Pain:  Vitals:   08/27/16 1217  TempSrc:   PainSc: 4    Pain Goal:                 Madison Hickman

## 2016-08-27 NOTE — Lactation Note (Signed)
This note was copied from a baby's chart. Lactation Consultation Note  Patient Name: Boy Kalysa Plummer TKPTW'S Date: 08/27/2016 Reason for consult: Initial assessment  Baby 8 hours old. Mom and baby resting--baby in crib. Mom reports that she nursed first 2 children 13 months each. Mom states that she is having some trouble getting baby to latch deeply. Discussed positioning and enc hand expressing for baby to taste EBM while latching. Enc mom to call for assistance as needed. Mom given [facility] brochure, aware of OP/BFSG and LC phone line assistance after D/C.   Maternal Data Has patient been taught Hand Expression?: Yes (Per mom.) Does the patient have breastfeeding experience prior to this delivery?: Yes  Feeding Feeding Type: Breast Fed Length of feed: 25 min  LATCH Score                   Interventions Interventions: Breast feeding basics reviewed  Lactation Tools Discussed/Used     Consult Status Consult Status: Follow-up Date: 08/28/16 Follow-up type: In-patient    Sherlyn Hay 08/27/2016, 9:21 AM

## 2016-08-27 NOTE — Consult Note (Addendum)
Neonatology Note:  Attendance at Code Apgar:   Our team responded to a Code Apgar call to room # 163 following NSVD, due to infant with shoulder dystocia. The requesting physician was Dr. Idelle Jo. The mother is a G3P2002, GBS neg with good PNC. ROM occurred 7 hours PTD and the fluid was clear.  At delivery, the baby with shoulder dystocia. Cord cut immediately.  The OB nursing staff in attendance called a Code Apgar. Our team first arrived at birth, at which time the baby was transitioning well. I continued assessment of transition.  Clavicles examined and appear intact by palpation.  HR >100, pink, and good cry.  Ap 8/9. Continue care of baby under routine care by Pediatrician.   Kylie Kid Leary Roca, MD

## 2016-08-27 NOTE — Plan of Care (Signed)
Problem: Life Cycle: Goal: Risk for postpartum hemorrhage will decrease Outcome: Completed/Met Date Met: 08/27/16 Patients bleeding wnl, patient educated on locia, amount and color.  Bladder and bowel care done

## 2016-08-28 MED ORDER — IBUPROFEN 600 MG PO TABS: 600.0000 mg | ORAL_TABLET | Freq: Four times a day (QID) | ORAL | 0 refills | Status: DC

## 2016-08-28 NOTE — Progress Notes (Signed)
PPD 2 SVD with 1st degree repair  S:  Reports feeling well             Tolerating po/ No nausea or vomiting             Bleeding is light             Pain controlled with motrin             Up ad lib / ambulatory / voiding QS  Newborn breast feeding  / Circ planned today O:               VS: BP (!) 89/42   Pulse (!) 58   Temp 98.4 F (36.9 C) (Oral)   Resp 16   Ht 5\' 3"  (1.6 m)   Wt 69.9 kg (154 lb)   LMP 11/20/2015 (Exact Date)   SpO2 98%   Breastfeeding? Unknown   BMI 27.28 kg/m    LABS:              Recent Labs  08/26/16 1550 08/27/16 0537  WBC 8.1 13.5*  HGB 11.5* 10.5*  PLT 173 153               Blood type: --/--/B POS, B POS (08/21 1550)  Rubella: Immune (01/26 0000)                        Physical Exam:             Alert and oriented X3  Abdomen: soft, non-tender, non-distended              Fundus: firm, non-tender, U-1  Perineum: no edema  Lochia: light  Extremities: no edema, no calf pain or tenderness    A: PPD # 2   Doing well - stable status  P: Routine post partum orders  DC home  Marlinda Mike CNM, MSN, [facility] 08/28/2016, 10:23 AM

## 2016-08-28 NOTE — Progress Notes (Signed)
CSW received consult for hx of Anxiety. CSW met with MOB to offer support and complete assessment.   Upon this writers arrival, MOB was breast feeding baby while FOB was observing from the couch. This writer explained role and reasoning for visit. MOB was warm and welcoming. This writer inquired about hx of anxiety. MOB notes she has dealt with anxiety for many years now; however, it was heightened after the delivery of her first born child. MOB further noted both that delivery and this delivery being very traumatic and nothing like what she imagined it to be thus causing her anxiety to trigger. CSW empathized with MOB and provided brief support as she became tearful with this writer and informed her that all deliveries are different and while we would like to hope for no complications anything can happen. CSW encouraged MOB to think about the positives being that baby has delivered and is healthy. MOB noted thinking only about the positive is exactly what is helping her cope at this time. MOB noted she is doing and feeling a lot better today.   CSW provided education regarding the baby blues period vs. perinatal mood disorders, discussed treatment and gave resources for mental health follow up if concerns arise. CSW recommends self-evaluation during the postpartum time period using the New Mom Checklist from Postpartum Progress and encouraged MOB to contact a medical professional if symptoms are noted at any time.  CSW provided review of Sudden Infant Death Syndrome (SIDS) precautions.  CSW identifies no further need for intervention and no barriers to discharge at this time.  Laurina Fischl, MSW, LCSW-A Clinical Social Worker  [facility]  Office: [phone]  

## 2016-08-28 NOTE — Plan of Care (Signed)
Problem: Coping: Goal: Ability to cope will improve Outcome: Completed/Met Date Met: 08/28/16 Mom has a hx of anxiety, she is aware of her issues and has been handling the new baby and her del well. Educated mom of comfort care for the baby and also base line ppd scores done with education on repeating prior to her 6wk chk up

## 2016-08-28 NOTE — Lactation Note (Signed)
This note was copied from a baby's chart. Lactation Consultation Note  Patient Name: Boy Chidinma Garl [id number] Date: 08/28/2016 Reason for consult: Follow-up assessment   P3, Baby 37 hours old and being circumcised. Mother's nipples are beginning to get tender.  Encouraged hand expression. She has her own person lanolin free nipple cream to apply. Discussed depth and positioning to improve soreness. Reviewed cluster feeding and feeding patterns after circ. Mom encouraged to feed baby 8-12 times/24 hours and with feeding cues.  Suggest family call if they need assistance w/ latching.    Maternal Data    Feeding Feeding Type: Breast Fed Length of feed: 30 min  LATCH Score Latch: Grasps breast easily, tongue down, lips flanged, rhythmical sucking.  Audible Swallowing: Spontaneous and intermittent  Type of Nipple: Everted at rest and after stimulation  Comfort (Breast/Nipple): Soft / non-tender  Hold (Positioning): No assistance needed to correctly position infant at breast.  LATCH Score: 10  Interventions    Lactation Tools Discussed/Used     Consult Status Consult Status: Follow-up Date: 08/29/16 Follow-up type: In-patient    Dahlia Byes [facility] 08/28/2016, 2:50 PM

## 2016-08-28 NOTE — Discharge Summary (Signed)
Obstetric Discharge Summary Reason for Admission: onset of labor Prenatal Procedures: none Intrapartum Procedures: spontaneous vaginal delivery and epidural anesthesia / management for shoulder dystocia Postpartum Procedures: none Complications-Operative and Postpartum: 1st degree perineal laceration Hemoglobin  Date Value Ref Range Status  08/27/2016 10.5 (L) 12.0 - 15.0 g/dL Final   HCT  Date Value Ref Range Status  08/27/2016 30.4 (L) 36.0 - 46.0 % Final    Physical Exam:  General: alert, cooperative and no distress Lochia: appropriate Uterine Fundus: firm Incision: healing well DVT Evaluation: No evidence of DVT seen on physical exam.  Discharge Diagnoses: Term Pregnancy-delivered  Discharge Information: Date: 08/28/2016 Activity: pelvic rest Diet: routine Medications: PNV and Ibuprofen Condition: stable Instructions: refer to practice specific booklet Discharge to: home Follow-up Information    Karena Addison, CNM. Schedule an appointment as soon as possible for a visit in 2 week(s).   Specialty:  Obstetrics and Gynecology Why:  anxiety evaluation Contact information: [address] [place] [address] [phone]           Newborn Data: Live born female  Birth Weight: 8 lb 13.1 oz (4000 g) APGAR: 8, 9  Home with mother.  Marlinda Mike 08/28/2016, 11:05 AM

## 2017-04-09 ENCOUNTER — Encounter: Payer: Self-pay | Admitting: Physician Assistant

## 2017-08-27 IMAGING — CR DG ANKLE COMPLETE 3+V*R*
4 series · 4 of 4 positions shown · non-contrast
Comparison: None.

CLINICAL DATA: Twisted ankle, fall

EXAM:
RIGHT ANKLE - COMPLETE 3+ VIEW

[AP]
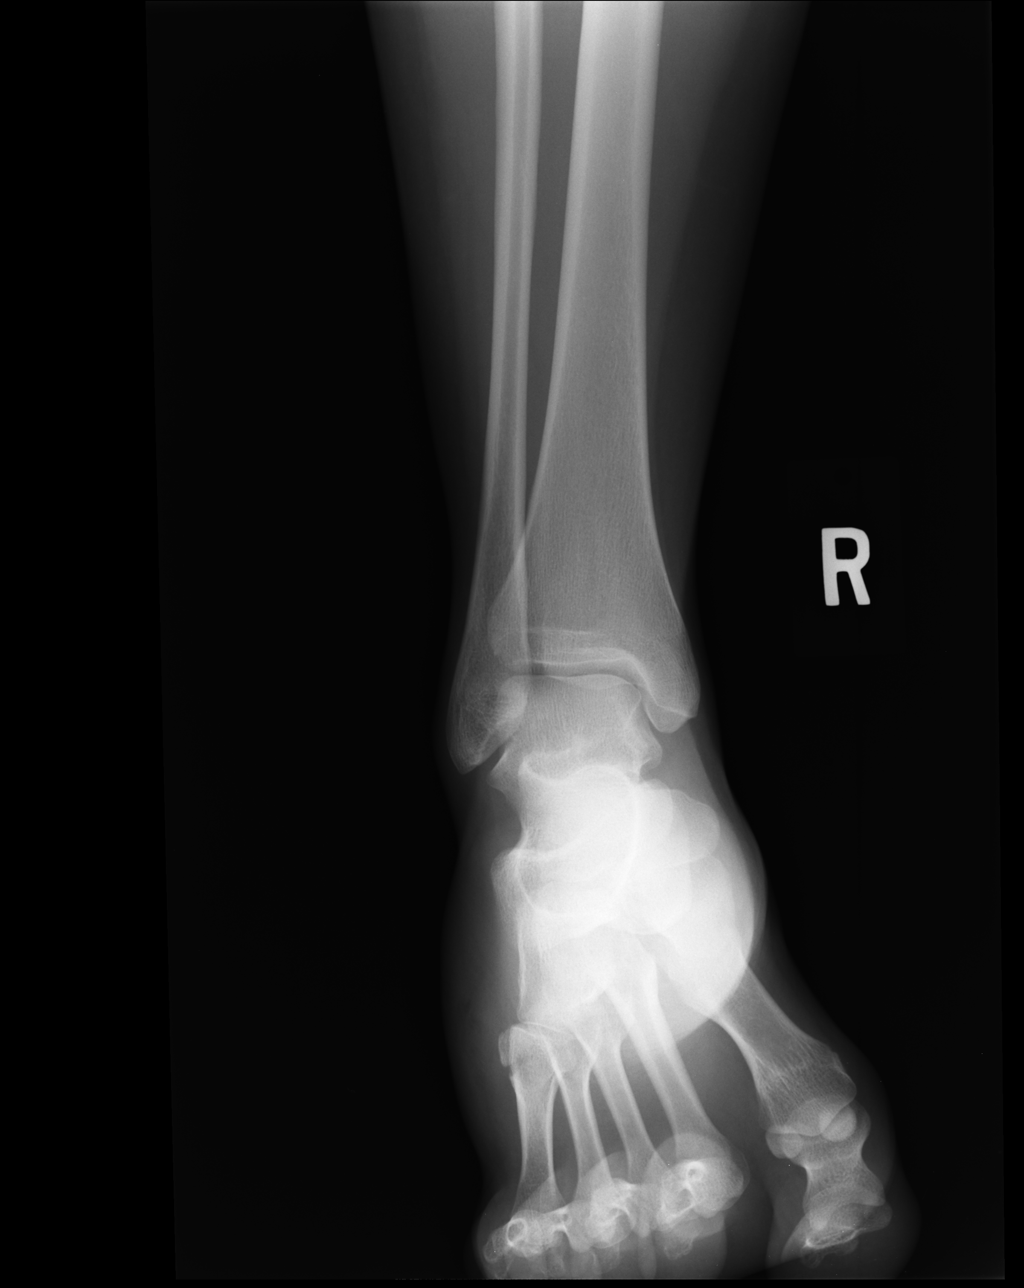

[ap obl int rot (1 of 2)]
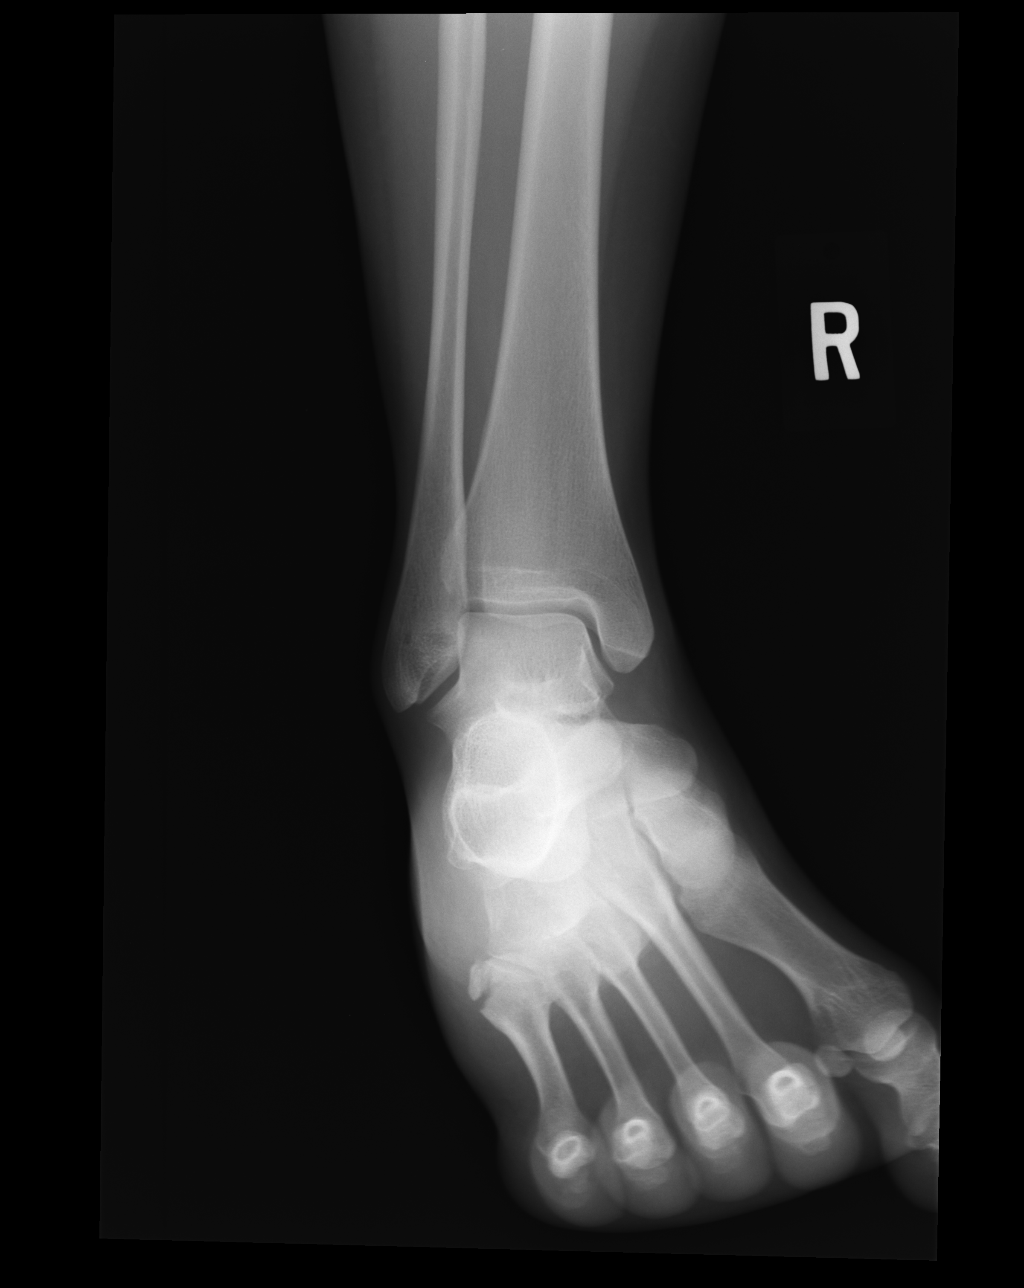

[lateral]
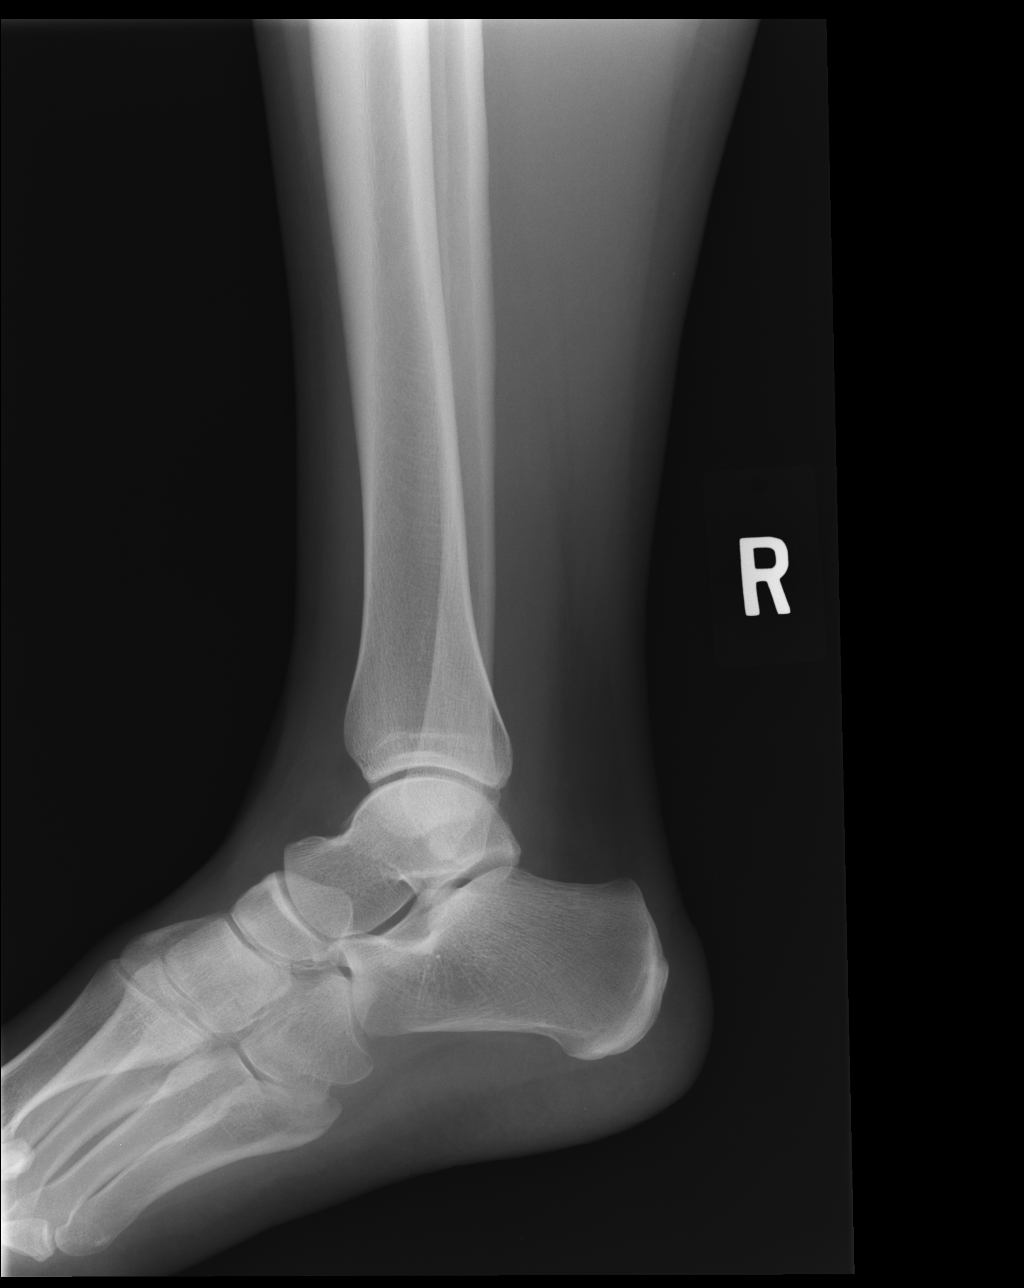

[ap obl int rot (2 of 2)]
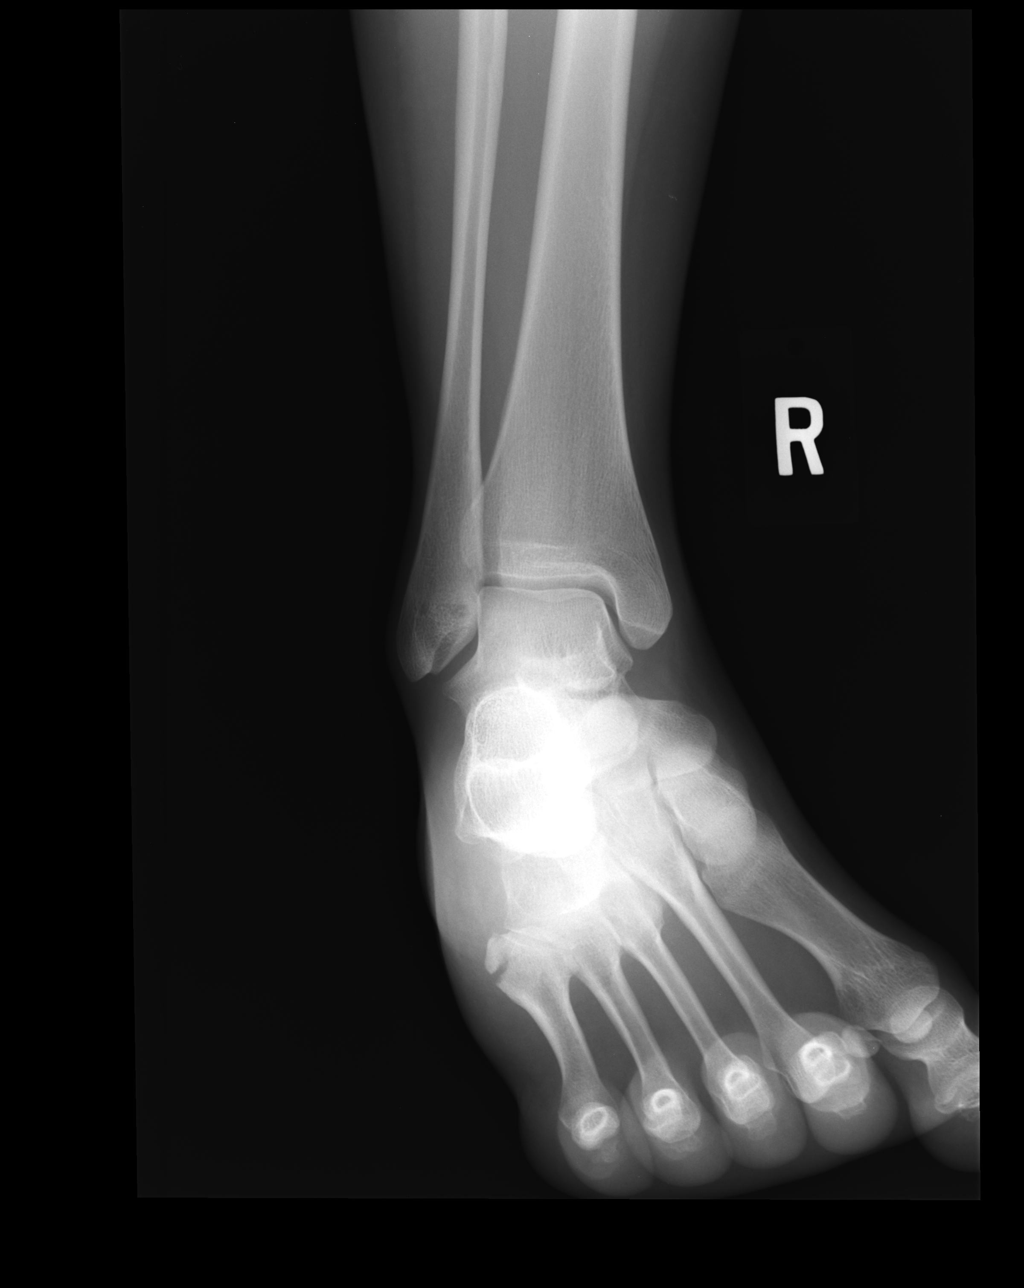

[4 of 4 positions shown; findings below may reference images not displayed]

FINDINGS: No fracture or dislocation is seen in the ankle.

The ankle mortise is intact.

Fracture involving the base of the 5th metatarsal.

Overlying lateral soft tissue swelling.
IMPRESSION: Fracture involving the base of the 5th metatarsal.

## 2019-04-21 ENCOUNTER — Telehealth: Payer: Self-pay | Admitting: Adult Health

## 2019-04-21 ENCOUNTER — Telehealth: Payer: Self-pay | Admitting: Oncology

## 2019-04-21 NOTE — Telephone Encounter (Signed)
Received a new hem referral from [facility] for low wbc. Pt has been cld and scheduled to see Mardella Layman on 4/27 at 2:30pm w/labs at 2pm. Pt aware to arrive 15 minutes early.

## 2019-04-21 NOTE — Telephone Encounter (Signed)
Pt has been rescheduled to see Dr. Clelia Croft on 4/16 at 2pm.

## 2019-04-22 ENCOUNTER — Inpatient Hospital Stay: Payer: Commercial Managed Care - PPO | Attending: Oncology | Admitting: Oncology

## 2019-04-22 ENCOUNTER — Other Ambulatory Visit: Payer: Self-pay

## 2019-04-22 VITALS — BP 121/68 | HR 107 | Temp 98.7°F | Resp 18 | Ht 63.0 in | Wt 123.4 lb

## 2019-04-22 DIAGNOSIS — D72819 Decreased white blood cell count, unspecified: Secondary | ICD-10-CM | POA: Insufficient documentation

## 2019-04-22 DIAGNOSIS — F419 Anxiety disorder, unspecified: Secondary | ICD-10-CM | POA: Insufficient documentation

## 2019-04-22 DIAGNOSIS — D649 Anemia, unspecified: Secondary | ICD-10-CM | POA: Diagnosis not present

## 2019-04-22 DIAGNOSIS — D709 Neutropenia, unspecified: Secondary | ICD-10-CM | POA: Diagnosis not present

## 2019-04-22 NOTE — Progress Notes (Signed)
Reason for the request:    Leukocytopenia  HPI: I was asked by Lars Pinks CNM for the evaluation of leukocytopenia. She is a 39 year old woman without any significant comorbid conditions had a laboratory testing done on April 13, 2019 and at that time her white cell count was 3.5, hemoglobin of 13.1 and a platelet count of 261. Previous says CBC obtained on March 25, 2019 showed similar values. Previous counts dating back to 2018 during her hospitalization for labor showed high her white cell count 8.1 and 13.5 respectively.  Clinically, she reports no complaints at this time.  She denies any fevers chills or joint pain.  She denies any recent hospitalization or illnesses.  She denies any recent infections or fevers.     She does not report any headaches, blurry vision, syncope or seizures. Does not report any fevers, chills or sweats.  Does not report any cough, wheezing or hemoptysis.  Does not report any chest pain, palpitation, orthopnea or leg edema.  Does not report any nausea, vomiting or abdominal pain.  Does not report any constipation or diarrhea.  Does not report any skeletal complaints.    Does not report frequency, urgency or hematuria.  Does not report any skin rashes or lesions. Does not report any heat or cold intolerance.  Does not report any lymphadenopathy or petechiae.  Does not report any anxiety or depression.  Remaining review of systems is negative.    Past Medical History:  Diagnosis Date  . Anemia    During previous pregnancy;needed FeSO4 supp  . Anxiety    No meds  . Infection    Yeast x 1  :  Past Surgical History:  Procedure Laterality Date  . WISDOM TOOTH EXTRACTION     All are removed  :   Current Outpatient Medications:  .  fluticasone (FLONASE) 50 MCG/ACT nasal spray, Place 1 spray into both nostrils daily as needed for allergies or rhinitis. , Disp: , Rfl:  .  ibuprofen (ADVIL,MOTRIN) 600 MG tablet, Take 1 tablet (600 mg total) by mouth every 6 (six)  hours., Disp: 30 tablet, Rfl: 0 .  loratadine (CLARITIN) 10 MG tablet, Take 10 mg by mouth daily as needed for allergies. , Disp: , Rfl:  .  OVER THE COUNTER MEDICATION, Take 10 mLs by mouth 2 (two) times daily. floradix, Disp: , Rfl:  .  Prenatal Vit-Fe Fumarate-FA (PRENATAL MULTIVITAMIN) TABS tablet, Take 1 tablet by mouth daily at 12 noon., Disp: , Rfl: :  No Known Allergies:  Family History  Problem Relation Age of Onset  . Heart attack Maternal Grandfather        Deceased  . Kidney disease Maternal Grandfather        Dialysis  . Skin cancer Maternal Grandfather   . Heart attack Paternal Grandfather   . Congestive Heart Failure Paternal Grandfather        Deceased  . Hypertension Maternal Grandmother   . Diabetes Maternal Grandmother   . Anemia Mother   . Asthma Mother   . Anxiety disorder Mother   . Migraines Father   . Bipolar disorder Paternal Aunt   :  Social History   Socioeconomic History  . Marital status: Married    Spouse name: Darnelle Maffucci Sky  . Number of children: 1  . Years of education: 23  . Highest education level: Not on file  Occupational History  . 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 Smoking status: Never Smoker  . Smokeless  tobacco: Never Used  Substance and Sexual Activity  . Alcohol use: No    Comment: Occasional glass of wine before pregnancy  . Drug use: No  . Sexual activity: Yes    Partners: Male  Other Topics Concern  . Not on file  Social History Narrative  . Not on file   Social Determinants of Health   Financial Resource Strain:   . Difficulty of Paying Living Expenses:   Food Insecurity:   . Worried About Charity fundraiser in the Last Year:   . Arboriculturist in the Last Year:   Transportation Needs:   . Film/video editor (Medical):   Marland Kitchen Lack of Transportation (Non-Medical):   Physical Activity:   . Days of Exercise per Week:   . Minutes of Exercise per Session:   Stress:   . Feeling of Stress :    Social Connections:   . Frequency of Communication with Friends and Family:   . Frequency of Social Gatherings with Friends and Family:   . Attends Religious Services:   . Active Member of Clubs or Organizations:   . Attends Archivist Meetings:   Marland Kitchen Marital Status:   Intimate Partner Violence:   . Fear of Current or Ex-Partner:   . Emotionally Abused:   Marland Kitchen Physically Abused:   . Sexually Abused:   :  Pertinent items are noted in HPI.  Exam: Blood pressure 121/68, pulse (!) 107, temperature 98.7 F (37.1 C), temperature source Temporal, resp. rate 18, height _0  (1.6 m), weight 123 lb 6.4 oz (56 kg), SpO2 100 %, unknown if currently breastfeeding.  ECOG 0  General appearance: alert and cooperative appeared without distress. Head: atraumatic without any abnormalities. Eyes: conjunctivae/corneas clear. PERRL.  Sclera anicteric. Throat: lips, mucosa, and tongue normal; without oral thrush or ulcers. Resp: clear to auscultation bilaterally without rhonchi, wheezes or dullness to percussion. Cardio: regular rate and rhythm, S1, S2 normal, no murmur, click, rub or gallop GI: soft, non-tender; bowel sounds normal; no masses,  no organomegaly Skin: Skin color, texture, turgor normal. No rashes or lesions Lymph nodes: Cervical, supraclavicular, and axillary nodes normal. Neurologic: Grossly normal without any motor, sensory or deep tendon reflexes. Musculoskeletal: No joint deformity or effusion.   CBC    Component Value Date/Time   WBC 13.5 (H) 08/27/2016 0537   RBC 3.33 (L) 08/27/2016 0537   HGB 10.5 (L) 08/27/2016 0537   HCT 30.4 (L) 08/27/2016 0537   PLT 153 08/27/2016 0537   MCV 91.3 08/27/2016 0537   MCH 31.5 08/27/2016 0537   MCHC 34.5 08/27/2016 0537   RDW 15.7 (H) 08/27/2016 0537   LYMPHSABS 2.0 01/06/2012 1521   MONOABS 0.5 01/06/2012 1521   EOSABS 0.1 01/06/2012 1521   BASOSABS 0.0 01/06/2012 1521      Chemistry   No results found for: NA, K, CL,  CO2, BUN, CREATININE, GLU No results found for: CALCIUM, ALKPHOS, AST, ALT, BILITOT     Assessment and Plan:   39 year old woman with:  1. Leukocytopenia with normal hemoglobin and hematocrit as well as platelet count and indices. She had fluctuation in her normal white cell count previously with normal differential.  The differential diagnosis was reviewed today with the patient. Benign reactive variation is the most likely etiology at this time. Autoimmune disorder versus medication could also be contributing.  Bone marrow disease and lymphoproliferative process are considered less likely. Myelodysplastic syndrome is also considered less likely. I see no need  for imaging studies or bone marrow biopsy at this time.  From a management standpoint, I do not recommend any further intervention at this time. I do recommend a period of observation and repeat counts in 4 to 6 months.  2. Follow-up: We will repeat counts in 3 to 4 months for follow-up evaluation and repeat counts.   45  minutes were dedicated to this visit. The time was spent on reviewing laboratory data, differential diagnosis and answering questions regarding future plan.     A copy of this consult has been forwarded to the requesting physician.

## 2019-05-03 ENCOUNTER — Encounter: Payer: Commercial Managed Care - PPO | Admitting: Adult Health

## 2019-05-03 ENCOUNTER — Other Ambulatory Visit: Payer: Commercial Managed Care - PPO

## 2019-07-26 ENCOUNTER — Inpatient Hospital Stay: Payer: Commercial Managed Care - PPO

## 2019-07-26 ENCOUNTER — Other Ambulatory Visit: Payer: Self-pay

## 2019-07-26 ENCOUNTER — Inpatient Hospital Stay: Payer: Commercial Managed Care - PPO | Attending: Oncology | Admitting: Oncology

## 2019-07-26 VITALS — BP 136/70 | HR 120 | Temp 97.5°F | Resp 20 | Ht 63.0 in | Wt 126.8 lb

## 2019-07-26 DIAGNOSIS — D709 Neutropenia, unspecified: Secondary | ICD-10-CM

## 2019-07-26 DIAGNOSIS — D72819 Decreased white blood cell count, unspecified: Secondary | ICD-10-CM | POA: Insufficient documentation

## 2019-07-26 LAB — CBC WITH DIFFERENTIAL (CANCER CENTER ONLY)
Abs Immature Granulocytes: 0.03 10*3/uL (ref 0.00–0.07)
Basophils Absolute: 0 10*3/uL (ref 0.0–0.1)
Basophils Relative: 0 %
Eosinophils Absolute: 0 10*3/uL (ref 0.0–0.5)
Eosinophils Relative: 0 %
HCT: 39.9 % (ref 36.0–46.0)
Hemoglobin: 13.1 g/dL (ref 12.0–15.0)
Immature Granulocytes: 0 %
Lymphocytes Relative: 23 %
Lymphs Abs: 2.2 10*3/uL (ref 0.7–4.0)
MCH: 30.7 pg (ref 26.0–34.0)
MCHC: 32.8 g/dL (ref 30.0–36.0)
MCV: 93.4 fL (ref 80.0–100.0)
Monocytes Absolute: 0.6 10*3/uL (ref 0.1–1.0)
Monocytes Relative: 6 %
Neutro Abs: 6.4 10*3/uL (ref 1.7–7.7)
Neutrophils Relative %: 71 %
Platelet Count: 255 10*3/uL (ref 150–400)
RBC: 4.27 MIL/uL (ref 3.87–5.11)
RDW: 12.1 % (ref 11.5–15.5)
WBC Count: 9.2 10*3/uL (ref 4.0–10.5)
nRBC: 0 % (ref 0.0–0.2)

## 2019-07-26 NOTE — Progress Notes (Signed)
Hematology and Oncology Follow Up Visit  Kylie Phillips [id number] [birth year] 39 y.o. 07/26/2019 2:52 PM Patient, No Pcp PerNo ref. provider found   Principle Diagnosis: 39 year old woman with leukocytopenia noted in April 2021.  She was found to have a white cell count of 3.5 but otherwise normal CBC.  His findings reactive and of benign etiology.    Current therapy: Active surveillance  Interim History: [name] returns today for a follow-up visit.  Since the last visit, she reports no recent complaints illnesses.  She denies recent hospitalizations or recurrent infections.  Performance status quality of life remain excellent.  She denies any easy bruising or hematochezia.     Medications: I have reviewed the patient's current medications.  Current Outpatient Medications  Medication Sig Dispense Refill  . fluticasone (FLONASE) 50 MCG/ACT nasal spray Place 1 spray into both nostrils daily as needed for allergies or rhinitis.     . Multiple Vitamins-Minerals (WOMENS MULTIVITAMIN PO) Take by mouth.     No current facility-administered medications for this visit.     Allergies: No Known Allergies    Physical Exam: Blood pressure 136/70, pulse (!) 120, temperature (!) 97.5 F (36.4 C), temperature source Temporal, resp. rate 20, height 5\' 3"  (1.6 m), weight 126 lb 12.8 oz (57.5 kg), SpO2 100 %, unknown if currently breastfeeding. ECOG: 0   General appearance: Comfortable appearing without any discomfort Head: Normocephalic without any trauma Oropharynx: Mucous membranes are moist and pink without any thrush or ulcers. Eyes: Pupils are equal and round reactive to light. Lymph nodes: No cervical, supraclavicular, inguinal or axillary lymphadenopathy.   Heart:regular rate and rhythm.  S1 and S2 without leg edema. Lung: Clear without any rhonchi or wheezes.  No dullness to percussion. Abdomin: Soft, nontender, nondistended with good bowel sounds.  No  hepatosplenomegaly. Musculoskeletal: No joint deformity or effusion.  Full range of motion noted. Neurological: No deficits noted on motor, sensory and deep tendon reflex exam. Skin: No petechial rash or dryness.  Appeared moist.      Lab Results: Lab Results  Component Value Date   WBC 9.2 07/26/2019   HGB 13.1 07/26/2019   HCT 39.9 07/26/2019   MCV 93.4 07/26/2019   PLT 255 07/26/2019     Chemistry   No results found for: NA, K, CL, CO2, BUN, CREATININE, GLU No results found for: CALCIUM, ALKPHOS, AST, ALT, BILITOT      Impression and Plan:    39 year old woman with:  1. Leukocytopenia noted in April 2021.  She was found to have white cell count of 3.5 with normal differential and normal CBC otherwise.    Laboratory data obtained today including a CBC that showed normal white cell count at this time.  White cell count was 9.2 with a normal differential with normal hemoglobin and hematocrit.  Differential diagnosis for abnormal white cell count was reviewed at this time and that likely indicate poor reactive findings rather than a sign of a blood disorder.  From a management standpoint, no intervention or further work-up is needed at this time.  2. Follow-up:  I am happy to see her in the future as needed.   20  minutes were spent on this encounter.  The time was spent on reviewing her laboratory data, discussing differential diagnosis and future plan of care review.  May 2021, MD 7/20/20212:52 PM
